# Patient Record
Sex: Female | Born: 1975 | Race: Black or African American | Hispanic: No | Marital: Married | Smoking: Never smoker
Health system: Southern US, Community
[De-identification: ages and names within clinical notes are randomized; demographics above are authoritative.]

## PROBLEM LIST (undated history)

## (undated) DIAGNOSIS — Z87442 Personal history of urinary calculi: Secondary | ICD-10-CM

## (undated) DIAGNOSIS — I10 Essential (primary) hypertension: Secondary | ICD-10-CM

## (undated) DIAGNOSIS — E559 Vitamin D deficiency, unspecified: Secondary | ICD-10-CM

## (undated) DIAGNOSIS — K219 Gastro-esophageal reflux disease without esophagitis: Secondary | ICD-10-CM

## (undated) HISTORY — PX: CHOLECYSTECTOMY: SHX55

## (undated) HISTORY — DX: Essential (primary) hypertension: I10

## (undated) HISTORY — PX: OTHER SURGICAL HISTORY: SHX169

## (undated) HISTORY — PX: DILATION AND EVACUATION: SHX1459

## (undated) HISTORY — DX: Vitamin D deficiency, unspecified: E55.9

---

## 2000-10-10 ENCOUNTER — Emergency Department (HOSPITAL_COMMUNITY): Admission: EM | Admit: 2000-10-10 | Discharge: 2000-10-10 | Payer: Self-pay | Admitting: *Deleted

## 2001-07-13 ENCOUNTER — Emergency Department (HOSPITAL_COMMUNITY): Admission: EM | Admit: 2001-07-13 | Discharge: 2001-07-13 | Payer: Self-pay | Admitting: Emergency Medicine

## 2002-03-24 ENCOUNTER — Encounter: Admission: RE | Admit: 2002-03-24 | Discharge: 2002-03-24 | Payer: Self-pay | Admitting: Nephrology

## 2002-03-24 ENCOUNTER — Encounter: Payer: Self-pay | Admitting: Nephrology

## 2002-07-07 ENCOUNTER — Other Ambulatory Visit: Admission: RE | Admit: 2002-07-07 | Discharge: 2002-07-07 | Payer: Self-pay | Admitting: Obstetrics & Gynecology

## 2003-04-09 ENCOUNTER — Inpatient Hospital Stay (HOSPITAL_COMMUNITY): Admission: AD | Admit: 2003-04-09 | Discharge: 2003-04-09 | Payer: Self-pay | Admitting: Obstetrics and Gynecology

## 2003-05-20 ENCOUNTER — Inpatient Hospital Stay (HOSPITAL_COMMUNITY): Admission: AD | Admit: 2003-05-20 | Discharge: 2003-05-25 | Payer: Self-pay | Admitting: Obstetrics and Gynecology

## 2003-05-22 ENCOUNTER — Encounter (INDEPENDENT_AMBULATORY_CARE_PROVIDER_SITE_OTHER): Payer: Self-pay | Admitting: Specialist

## 2003-07-09 ENCOUNTER — Other Ambulatory Visit: Admission: RE | Admit: 2003-07-09 | Discharge: 2003-07-09 | Payer: Self-pay | Admitting: Obstetrics and Gynecology

## 2004-01-28 ENCOUNTER — Observation Stay (HOSPITAL_COMMUNITY): Admission: RE | Admit: 2004-01-28 | Discharge: 2004-01-29 | Payer: Self-pay | Admitting: General Surgery

## 2004-01-28 ENCOUNTER — Encounter (INDEPENDENT_AMBULATORY_CARE_PROVIDER_SITE_OTHER): Payer: Self-pay | Admitting: Specialist

## 2004-10-27 ENCOUNTER — Emergency Department (HOSPITAL_COMMUNITY): Admission: EM | Admit: 2004-10-27 | Discharge: 2004-10-28 | Payer: Self-pay | Admitting: Emergency Medicine

## 2004-11-02 ENCOUNTER — Ambulatory Visit (HOSPITAL_COMMUNITY): Admission: RE | Admit: 2004-11-02 | Discharge: 2004-11-02 | Payer: Self-pay | Admitting: Urology

## 2004-11-02 ENCOUNTER — Ambulatory Visit (HOSPITAL_BASED_OUTPATIENT_CLINIC_OR_DEPARTMENT_OTHER): Admission: RE | Admit: 2004-11-02 | Discharge: 2004-11-02 | Payer: Self-pay | Admitting: Urology

## 2005-04-13 ENCOUNTER — Ambulatory Visit: Payer: Self-pay | Admitting: Family Medicine

## 2005-05-11 ENCOUNTER — Encounter (INDEPENDENT_AMBULATORY_CARE_PROVIDER_SITE_OTHER): Payer: Self-pay | Admitting: Specialist

## 2005-05-11 ENCOUNTER — Ambulatory Visit (HOSPITAL_BASED_OUTPATIENT_CLINIC_OR_DEPARTMENT_OTHER): Admission: RE | Admit: 2005-05-11 | Discharge: 2005-05-11 | Payer: Self-pay | Admitting: Urology

## 2005-05-31 ENCOUNTER — Ambulatory Visit (HOSPITAL_COMMUNITY): Admission: RE | Admit: 2005-05-31 | Discharge: 2005-05-31 | Payer: Self-pay | Admitting: Urology

## 2006-07-25 ENCOUNTER — Inpatient Hospital Stay (HOSPITAL_COMMUNITY): Admission: AD | Admit: 2006-07-25 | Discharge: 2006-07-28 | Payer: Self-pay | Admitting: Obstetrics and Gynecology

## 2006-08-07 ENCOUNTER — Inpatient Hospital Stay (HOSPITAL_COMMUNITY): Admission: AD | Admit: 2006-08-07 | Discharge: 2006-08-07 | Payer: Self-pay | Admitting: Obstetrics & Gynecology

## 2006-08-31 ENCOUNTER — Inpatient Hospital Stay (HOSPITAL_COMMUNITY): Admission: AD | Admit: 2006-08-31 | Discharge: 2006-08-31 | Payer: Self-pay | Admitting: Obstetrics

## 2006-09-13 ENCOUNTER — Inpatient Hospital Stay (HOSPITAL_COMMUNITY): Admission: RE | Admit: 2006-09-13 | Discharge: 2006-09-15 | Payer: Self-pay | Admitting: Obstetrics and Gynecology

## 2006-09-13 ENCOUNTER — Encounter (INDEPENDENT_AMBULATORY_CARE_PROVIDER_SITE_OTHER): Payer: Self-pay | Admitting: Specialist

## 2006-11-07 ENCOUNTER — Ambulatory Visit (HOSPITAL_COMMUNITY): Admission: RE | Admit: 2006-11-07 | Discharge: 2006-11-07 | Payer: Self-pay | Admitting: Urology

## 2006-12-11 ENCOUNTER — Ambulatory Visit (HOSPITAL_COMMUNITY): Admission: RE | Admit: 2006-12-11 | Discharge: 2006-12-11 | Payer: Self-pay | Admitting: Urology

## 2007-01-16 ENCOUNTER — Ambulatory Visit: Payer: Self-pay | Admitting: Family Medicine

## 2007-01-16 DIAGNOSIS — I10 Essential (primary) hypertension: Secondary | ICD-10-CM | POA: Insufficient documentation

## 2007-01-20 LAB — CONVERTED CEMR LAB
ALT: 18 units/L (ref 0–35)
AST: 20 units/L (ref 0–37)
Albumin: 4.2 g/dL (ref 3.5–5.2)
Alkaline Phosphatase: 108 units/L (ref 39–117)
BUN: 10 mg/dL (ref 6–23)
Bilirubin, Direct: 0.1 mg/dL (ref 0.0–0.3)
CO2: 29 meq/L (ref 19–32)
Calcium: 9.4 mg/dL (ref 8.4–10.5)
Chloride: 105 meq/L (ref 96–112)
Creatinine, Ser: 0.9 mg/dL (ref 0.4–1.2)
GFR calc Af Amer: 96 mL/min
GFR calc non Af Amer: 79 mL/min
Glucose, Bld: 69 mg/dL — ABNORMAL LOW (ref 70–99)
Potassium: 4.5 meq/L (ref 3.5–5.1)
Sodium: 142 meq/L (ref 135–145)
Total Bilirubin: 0.8 mg/dL (ref 0.3–1.2)
Total Protein: 8.2 g/dL (ref 6.0–8.3)

## 2007-01-21 ENCOUNTER — Telehealth (INDEPENDENT_AMBULATORY_CARE_PROVIDER_SITE_OTHER): Payer: Self-pay | Admitting: *Deleted

## 2007-05-27 ENCOUNTER — Ambulatory Visit: Payer: Self-pay | Admitting: Family Medicine

## 2007-05-27 DIAGNOSIS — M899 Disorder of bone, unspecified: Secondary | ICD-10-CM | POA: Insufficient documentation

## 2007-05-27 DIAGNOSIS — M949 Disorder of cartilage, unspecified: Secondary | ICD-10-CM

## 2007-10-20 ENCOUNTER — Telehealth (INDEPENDENT_AMBULATORY_CARE_PROVIDER_SITE_OTHER): Payer: Self-pay | Admitting: *Deleted

## 2007-10-20 ENCOUNTER — Ambulatory Visit: Payer: Self-pay | Admitting: Family Medicine

## 2007-10-20 DIAGNOSIS — J029 Acute pharyngitis, unspecified: Secondary | ICD-10-CM

## 2007-10-20 LAB — CONVERTED CEMR LAB: Rapid Strep: NEGATIVE

## 2008-05-17 IMAGING — US US RENAL
1 series · 13 of 25 positions shown · non-contrast
Comparison: none

CLINICAL DATA: 31 weeks estimated gestational age with lower back pain. Patient has a history of prior lithotripsy and stent placement with prior pregnancy in 9333.
 RENAL/URINARY TRACT ULTRASOUND ? 07/25/06:
TECHNIQUE: Complete ultrasound examination of the urinary tract was performed including evaluation of the kidneys, renal collecting systems, and urinary bladder.

[Series 1: us renal · 0.28mm/px · 13 of 41 slices shown]
[im 1/41]
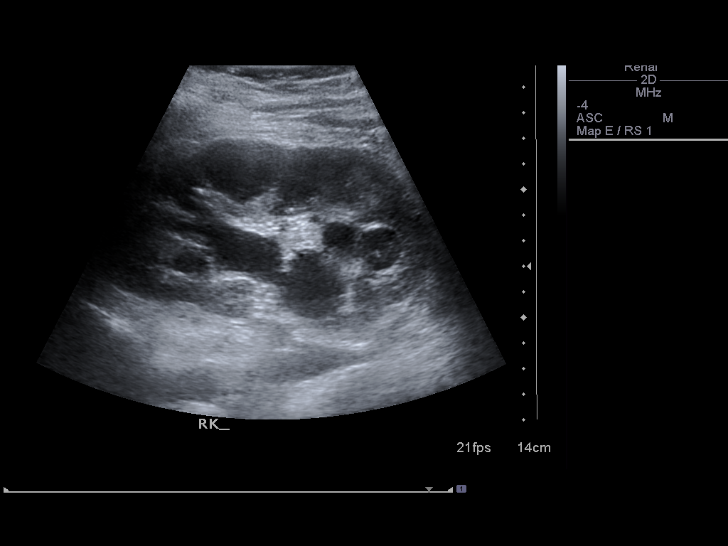
[im 4/41]
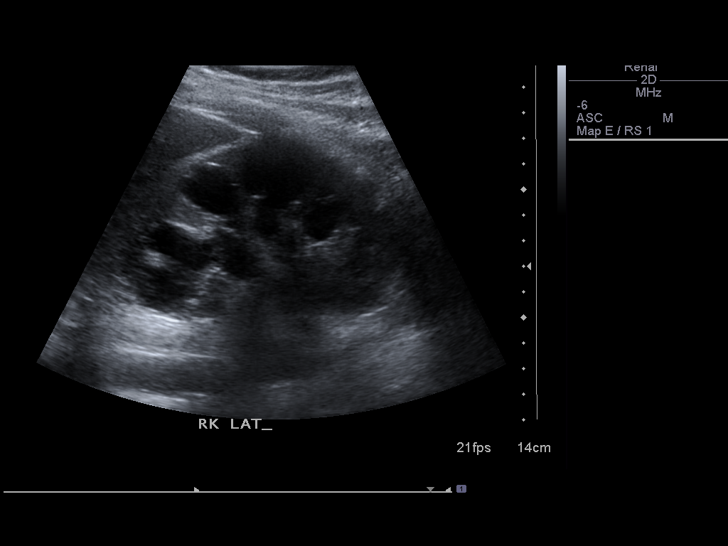
[im 7/41]
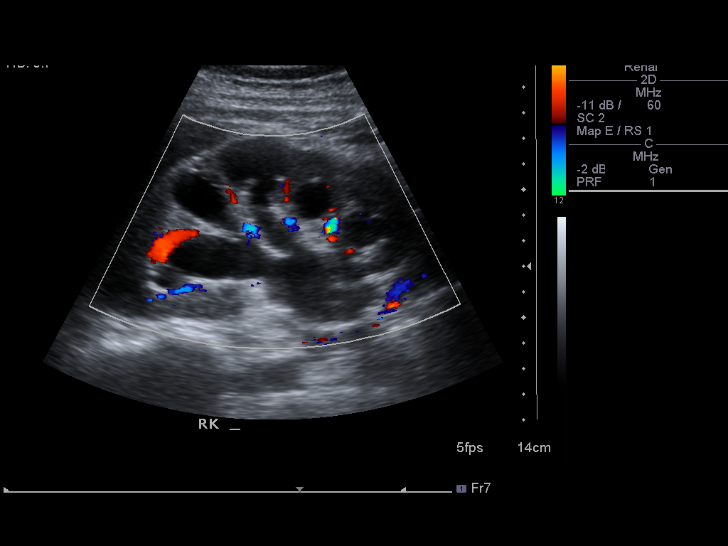
[im 11/41]
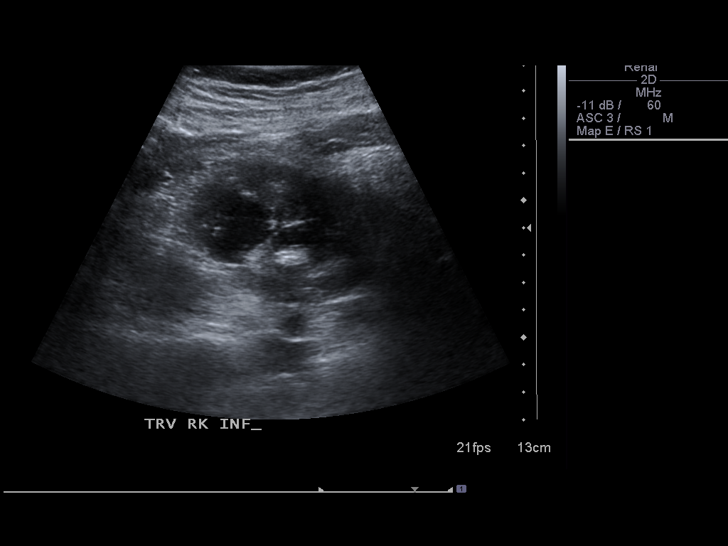
[im 14/41]
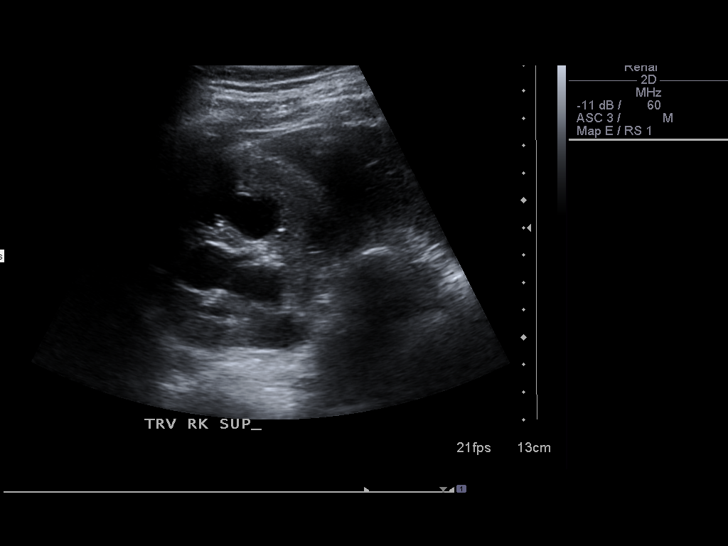
[im 17/41]
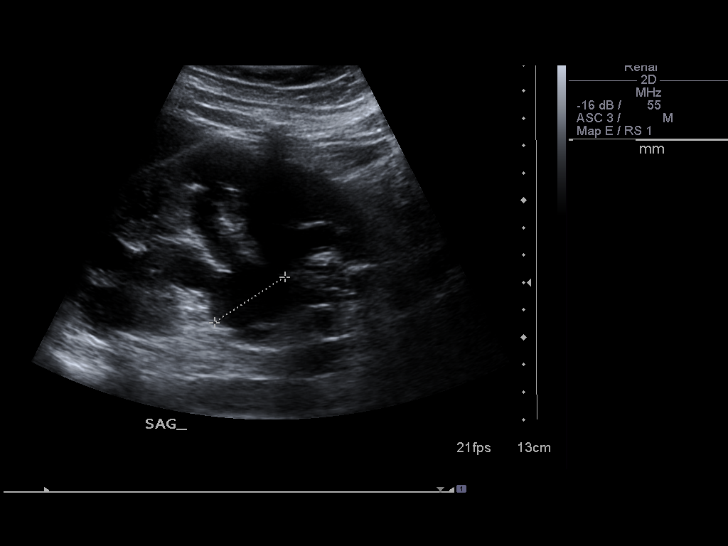
[im 21/41]
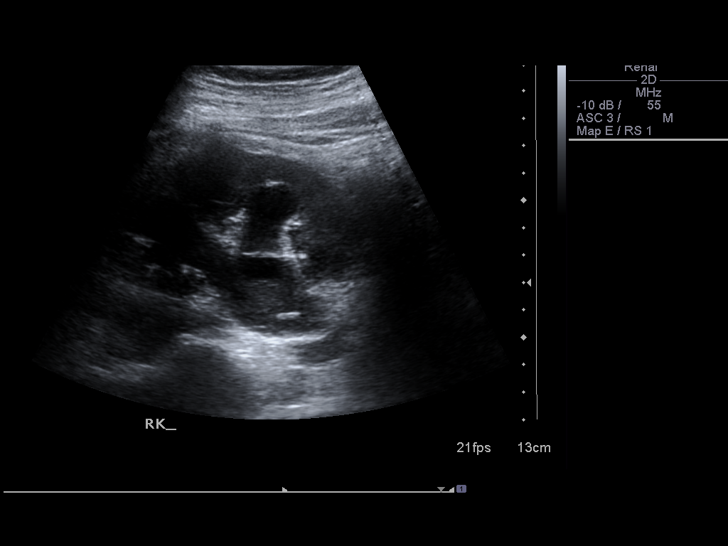
[im 24/41]
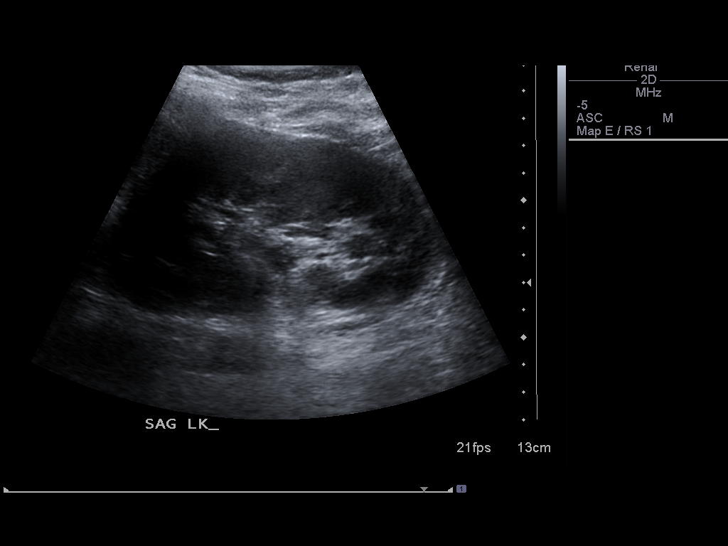
[im 27/41]
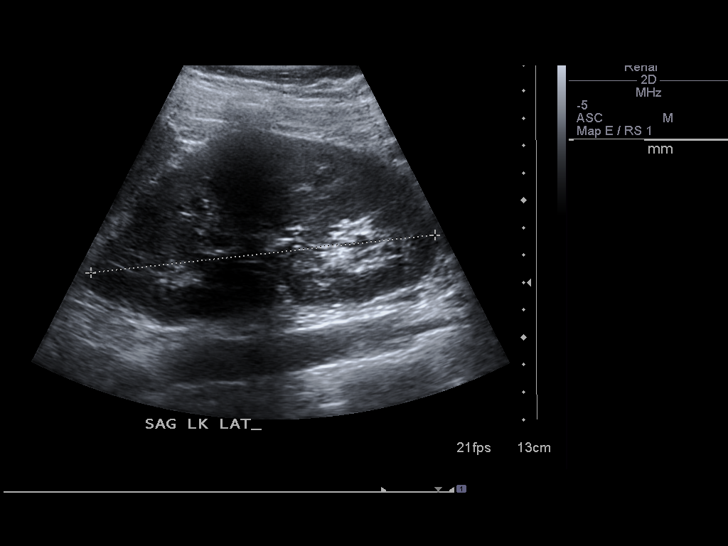
[im 31/41]
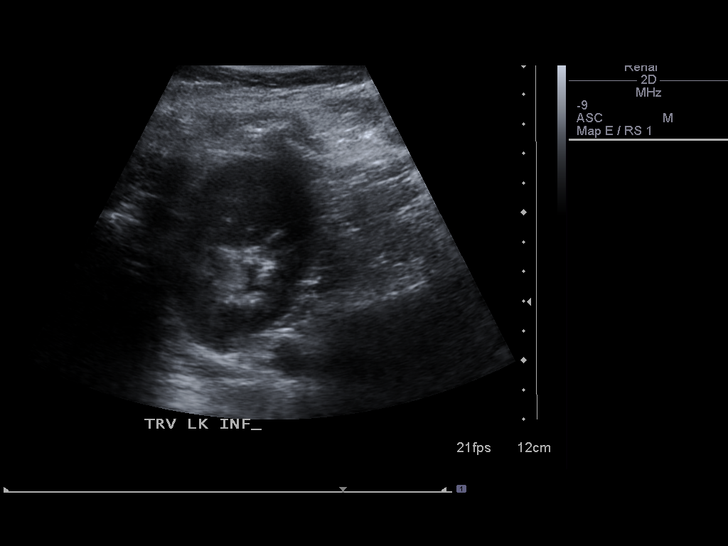
[im 34/41]
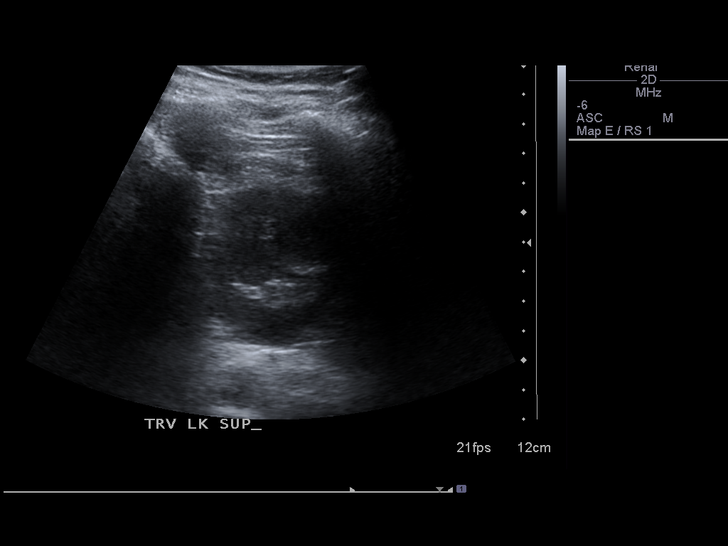
[im 37/41]
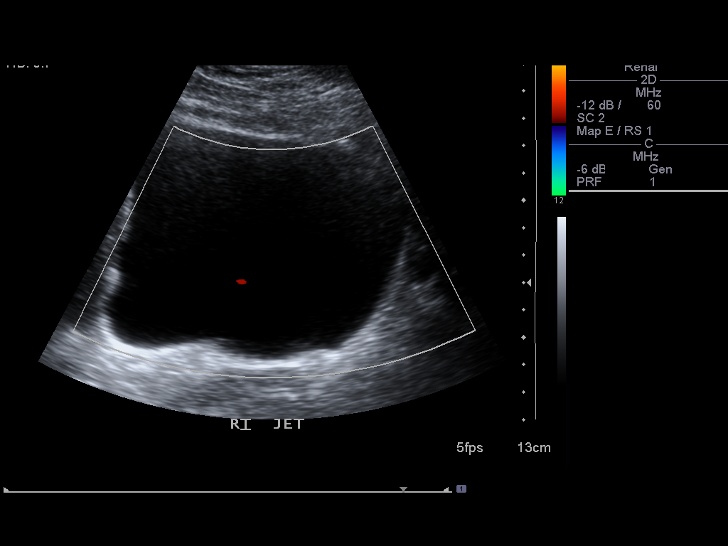
[im 41/41]
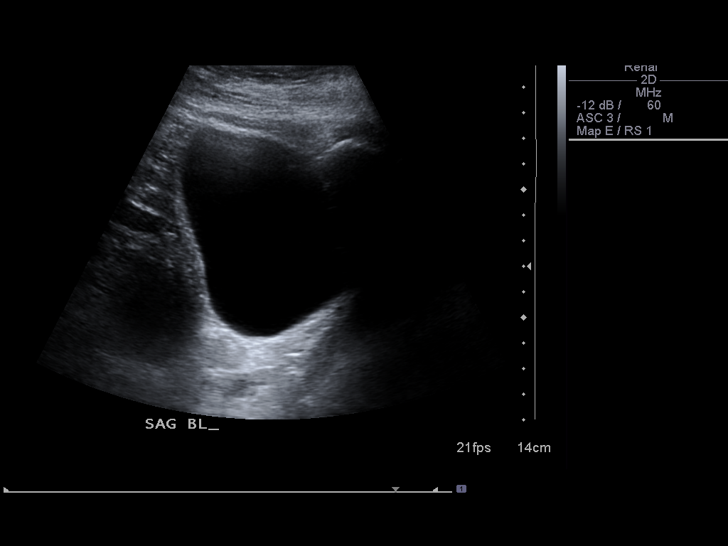

[13 of 25 positions shown; findings below may reference images not displayed]

FINDINGS: Multiple images of both kidneys were obtained.  The right kidney has a sagittal length of 11.6 cm and the left kidney has a sagittal length of 12.6 cm.  
 The right kidney is notable for the presence of moderate to severe pyelectasis with associated caliectasis and proximal ureterectasis.  The proximal ureter could not be followed much past the level of the renal pelvis.  There is a focal renal calculus identified in a lower pole calix measuring 1.2 cm in maximal length.  Visualization of the renal pelvis and proximal aspect of the ureter suggests the presence of some low-level echoes as well as possibly one echogenic focus which does not shadow and the finding is concerning for the possibility of some debris within the renal pelvis and possibly a nonshadowing calculus in this location as well.  Evaluation of the bladder does not reveal the presence of a right ureteral jet and this constellation of findings is worrisome for the possibility of an underlying obstructive process on this side.
 The left kidney has a normal appearance with no signs of pyelectasis, caliectasis, or focal parenchymal abnormality.  A strong left ureteral jet was noted.
IMPRESSION: 1.  Right lower pole renal calculus with moderate to severe pyelectasis and associated caliectasis and question of debris with a possible nonshadowing calculus in the region of the dilated right ureteropelvic junction.  These findings combined with lack of a visualized ureteral jet on this side raises the possibility of an obstructive process on this side.
 2.  Normal left kidney.

## 2010-07-23 ENCOUNTER — Encounter: Payer: Self-pay | Admitting: Urology

## 2010-07-24 ENCOUNTER — Encounter: Payer: Self-pay | Admitting: Urology

## 2010-11-14 NOTE — Op Note (Signed)
NAME:  Christine Walter, Christine Walter              ACCOUNT NO.:  [id number]   MEDICAL RECORD NO.:  [id number]          PATIENT TYPE:  AMB   LOCATION:  DAY                          FACILITY:  [facility]   PHYSICIAN:  Heloise Purpura, MD      DATE OF BIRTH:  1975/08/05   DATE OF PROCEDURE:  12/11/2006  DATE OF DISCHARGE:                               OPERATIVE REPORT   PREOPERATIVE DIAGNOSIS:  1. Right renal calculi.  2. Poorly functioning right kidney.   POSTOPERATIVE DIAGNOSIS:  1. Right renal calculi.  2. Poorly functioning right kidney.   PROCEDURE:  1. Cystoscopy.  2. Right retrograde pyelography.  3. Right ureteroscopy with laser lithotripsy and stone removal.  4. Right ureteral stent placement (6 by 24).   SURGEON:  Heloise Purpura, M.D.   ANESTHESIA:  General.   COMPLICATIONS:  None.   INDICATION:  Christine Walter is a 35 year old female who was recently found  to have an obstructing UPJ calculus with fever during her recent  pregnancy.  She subsequently delivered her child and followed up for  further evaluation.  She underwent a CT scan which demonstrated multiple  large right renal calculi in the lower and interpolar regions of the  kidney.  She was also found to have an atrophic right kidney.  She,  therefore, underwent a nuclear medicine renal scan which demonstrated  17% relative renal function of the right kidney.  We discussed this  finding and due to the patient's young age and history of hypertension,  to preserve as much nephrology function as possible.  We discussed  various approaches to her stones and she elected to undergo a  ureteroscopic procedure.  The potential risks and benefits of the above  procedures were discussed with the patient and informed consent was  obtained.   DESCRIPTION OF PROCEDURE:  The patient was taken to the operating room  and a general anesthetic was administered.  She was given preoperative  antibiotics, placed in the dorsal lithotomy position,  prepped and draped  in the usual sterile fashion.  Next, a preoperative time out was  performed.  Cystourethroscopy was then performed and the right ureteral  orifice was identified.  No bladder tumors, stones, or other mucosal  pathology was identified.  A 0.038 sensor guidewire was inserted up  through the right ureteral orifice into the right renal pelvis under  fluoroscopic guidance.  A 12/14 French ureteral access sheath was then  advanced over the wire.  The flexible ureteroscope was then advanced up  into the renal pelvis under direct visual guidance.  The entire renal  collecting system was examined.  No stones were identified in the upper  or interpolar regions of the kidney.  In the lower pole, two calyces  were found to have large renal calculi.  Laser lithotripsy was performed  of these stones.  Once the stones were adequately fragmented, the  nitinol basket was used to remove some fragments for stone analysis.  No  additional fragments were identified.  On CT scan, there was noted to be  an approximately 1 cm in the interpolar region.  This was initially not  identified.  A retrograde pyelogram was then performed and the kidney  was further evaluated.  A calculus was seen just below a layer of mucosa  in the interpolar region of the kidney.  This was identified and laser  lithotripsy was performed until the stone was adequately fragmented into  small pieces.  At this time, the ureteroscope was removed and a 0.038  sensor guidewire was advanced back up into the renal pelvis.  The  ureteral access sheath was removed.  A 6 by 24 double-J ureteral stent  was then advanced over the wire using Seldinger technique.  It was  appropriately positioned under fluoroscopic guidance and the wire was  removed with a good curl noted in the renal pelvis as well as in the  bladder.  The patient's bladder was emptied.  The patient tolerated the  procedure well without complications.  She was  able to be awakened and  transferred to the recovery unit in satisfactory condition.           ______________________________  Heloise Purpura, MD  Electronically Signed     LB/MEDQ  D:  12/11/2006  T:  12/11/2006  Job:  [id number]

## 2010-11-17 NOTE — Consult Note (Signed)
NAME:  Christine Walter, Christine Walter NO.:  [id number]   MEDICAL RECORD NO.:  [id number]          PATIENT TYPE:  MAT   LOCATION:  MATC                          FACILITY:  [facility]   PHYSICIAN:  Richardean Sale, M.D.   DATE OF BIRTH:  12/01/1975   DATE OF CONSULTATION:  07/25/2006  DATE OF DISCHARGE:                                 CONSULTATION   CHIEF COMPLAINT:  Back pain.   HISTORY OF PRESENT ILLNESS:  This is a 35 year old African-American  female, gravida 3, para 1-0-1-1, who was at [redacted] weeks gestation with a  due date of September 25, 2006, who presented to maternity admissions on  July 25, 2006 complaining of back pain.  Patient states her pain  began approximately 2 days ago, started out on the right side in the  upper back and is now across both sides of her back, both upper and  lower.  She reports fever of 100.5, as well as a few episodes of emesis  and decreased appetite.  She denies any dysuria or hematuria but does  complain of hesitancy, denies any vaginal bleeding, loss of fluid or  contractions, reports good fetal movement.   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  Patient had a stent in the right kidney that removed in 2006.  She is  followed by Dr. Wanda Plump for her urologic problems but has not been to  see him recently.  She has a history of recurrent UTIs and is on  Macrobid for prophylaxis.   OBSTETRIC HISTORY:  TAB x1, 39 weeks vaginal delivery x1.   SOCIAL HISTORY:  Denies tobacco, alcohol or drugs.   MEDICATIONS:  1. Macrobid.  2. Prenatal vitamins.  3. Tylenol.  4. Procardia.   ALLERGIES:  NO KNOWN DRUG ALLERGIES.   PHYSICAL EXAMINATION:  VITAL SIGNS:  Blood pressure is 108/73, pulse  124, respirations 20, temperature 98.3, fetal heart tones are in the  130s.  No decelerations, moderate variability, positive accelerations.  Tocometer:  Rare contraction.  GENERAL:  She is a well-developed, well-nourished  African-American  female who appears in no acute distress.  ABDOMEN:  Gravid, soft and nontender.  EXTREMITIES:  No cyanosis, clubbing or edema.  CERVIX:  Closed, thick and long.  BACK:  Bilateral CVA tenderness, right greater than left.   LABORATORY STUDIES:  Urinalysis shows specific gravity greater than  1.03, 100 of glucose, trace hemoglobin, moderate bilirubin, greater than  80 ketones, 100 of protein, positive nitrite and small leukocyte  esterase.  On the microscopic, white blood cells are 7-10 and bacteria  are many.   ASSESSMENT:  A 35 year old gravida 3, para 1-0-1-1 African-American  female at [redacted] weeks gestation, with flank pain, history of renal disease,  urinalysis suggestive of UTI.   PLAN:  1. Will start IV fluids and Ancef for suspected UTI.  2. Check renal ultrasound, evaluate for poly-nephritis and      hydronephrosis, given patient's history of renal disease.  3. Patient complains of pain at an 8/10.  Will use Stadol 1 mg with      Phenergan 12.5 mg  and re-dose as needed.  4. NBO until renal ultrasound is available and the urologic surgery is      required.  5. Continue Procardia for hypertension.  6. Continue fetal monitoring.      Richardean Sale, M.D.  Electronically Signed     JW/MEDQ  D:  07/25/2006  T:  07/25/2006  Job:  [id number]

## 2010-11-17 NOTE — Discharge Summary (Signed)
NAME:  PAIDYN, MCFERRAN                        ACCOUNT NO.:  [id number]   MEDICAL RECORD NO.:  [id number]                   PATIENT TYPE:  INP   LOCATION:  9133                                 FACILITY:  [facility]   PHYSICIAN:  Maxie Better, M.D.            DATE OF BIRTH:  December 06, 1975   DATE OF ADMISSION:  05/20/2003  DATE OF DISCHARGE:  05/25/2003                                 DISCHARGE SUMMARY   ADMISSION DIAGNOSES:  1. Intrauterine gestation at 38-5/7 weeks.  2. Chronic hypertension.  3. Term gestation, delivered.  4. Recurrent urinary tract infections.   DISCHARGE DIAGNOSES:  1. Term gestation, delivered.  2. Recurrent urinary tract infections.   PROCEDURE:  Vacuum assisted vaginal delivery.   HISTORY OF PRESENT ILLNESS:  This is a 35 year old gravida 2, para 0-0-1-0  married black female with chronic hypertension controlled on Procardia XL  admitted at 38-5/[redacted] weeks gestation for induction of labor.  Her prenatal  course had been complicated by recurrent urinary tract infections resulting  in suppressive therapy using Macrobid.  She had antepartum fetal  surveillance with nonstress testing.  Her last ultrasound on April 01, 2003 showed normal fetal growth.  PIH laboratories done on May 17, 2003  showed a uric acid of 6.1, platelet count of 139,000, hematocrit 35.  Normal  LFTs.  The patient's blood type is O+.  Rubella is immune.  Hepatitis B  surface antigen is negative.  Group B Strep culture was negative.   HOSPITAL COURSE:  The patient was admitted to [facility] on May 20, 2003.  She was continued on her Procardia XL as well as Macrobid for her  recurrent urinary tract infections.  Cervidil was placed for cervical  ripening due to her admission examination of closed, soft, -2, vertex  presentation.  PIH laboratories were reviewed.  On May 21, 2003 the  patient was started on Pitocin.  She reached 20 milliunits per minute  without any  cervical change.  Her tracing was reassuring.  Pitocin was  discontinued and cervical ripening was reinitiated using Cytotec.  After the  Cytotec on May 22, 2003 the patient's cervix improved to 1 cm, 70%, -2.  Artificial rupture of membranes was performed.  Moderate meconium was noted.  An intrauterine pressure catheter and internal scalp electrodes were placed.  Tracing during that time showed a baseline fetal heart rate of 140,  accelerations to 160, contractions every one to four minutes.  Pitocin on  the low dose protocol was started.  Amnioinfusion was also started.  The  patient was noted to have a dysfunctional uterine pattern and Pitocin was  continued as was the Amnioinfusion.  She subsequently progressed, became  fully dilated at 2:40 a.m.  Variable decelerations were noted with the  contractions.  Repetitive deep variable decelerations with each contraction  resulted in decision to apply vacuum extraction.  At that time the cervix  was fully +3, left  occiput anterior presentation.  Pull times:  1.  Vaginal  delivery live female.  DeLee'd on the perineum.  Cord around the neck x2 and  on the body which were reduced.  Apgars of 8 and 9.  Left lateral vaginal  laceration was noted.  It was repaired.  Weight of the baby was 7 pounds 13  ounces.  The patient subsequently had an uncomplicated postpartum course.  By postpartum day #2 she was doing well and blood pressure was 108/74 and  tachycardia.  She was deemed well to be discharged home.   DISPOSITION:  Home.   CONDITION ON DISCHARGE:  Stable.   DISCHARGE MEDICATIONS:  1. Macrobid 100 mg p.o. q.h.s.  2. Prenatal vitamins one p.o. daily.  3. Procardia XL as previously prescribed.  4. Nonsteroidals as needed.   FOLLOWUP:  Four to six weeks.   DISCHARGE INSTRUCTIONS:  Call for temperature greater than or equal to  100.4.  Nothing per vagina for four to six weeks.  Call if soaking a regular  pad every hour or more  frequently.                                               Maxie Better, M.D.    [place]/MEDQ  D:  06/24/2003  T:  06/24/2003  Job:  [id number]

## 2010-11-17 NOTE — Consult Note (Signed)
NAME:  Christine Walter, Christine Walter              ACCOUNT NO.:  [id number]   MEDICAL RECORD NO.:  [id number]          PATIENT TYPE:  INP   LOCATION:  9198                          FACILITY:  [facility]   PHYSICIAN:  Heloise Purpura, MD      DATE OF BIRTH:  [birth year]   DATE OF CONSULTATION:  07/25/2006  DATE OF DISCHARGE:                                 CONSULTATION   REASON FOR CONSULTATION:  Right hydronephrosis and fever.   HISTORY:  Christine Walter is a 35 year old G3, P4 female who is currently  [redacted] weeks pregnant.  She began having right-sided flank pain 2 days ago  and yesterday had nausea associated with fever up to 101 degrees.  Her  pain continued and therefore she presented to the maternity admissions  unit for further evaluation.  Her pain does radiate to the right lower  quadrant from the right flank.  She has not had any gross hematuria,  dysuria or other lower urinary tract symptoms except for urinary  frequency.   PAST MEDICAL HISTORY:  1. Hypertension.  2. Kidney stones previously treated by Dr. Wanda Plump.   PAST SURGICAL HISTORY:  Ureteral stent placement.   MEDICATIONS:  Procardia.   ALLERGIES:  NO KNOWN DRUG ALLERGIES.   FAMILY HISTORY:  No history of urolithiasis.   SOCIAL HISTORY:  No tobacco.   REVIEW OF SYSTEMS:  Positive for recent fever, nausea and vomiting.  All  other systems are reviewed and are negative.   PHYSICAL EXAMINATION:  VITALS:  Temperature 100.8, blood pressure  104/63, pulse 131, respirations 20.  CONSTITUTIONAL:  Alert and oriented, no acute distress.  CARDIOVASCULAR:  Sinus tachycardia without obvious murmurs.  LUNGS:  Clear bilaterally.  ABDOMEN:  Gravid with some right-sided abdominal tenderness on  palpation.  BACK:  Positive right CVA tenderness.  GU:  Deferred.  EXTREMITIES:  Trace bilateral lower extremity edema.  NEUROLOGIC:  Grossly intact.   LABORATORIES:  Urinalysis 7-10 white blood cells and many bacteria.  White blood count 16.9,  creatinine 0.9.   IMAGING:  The patient's renal ultrasound was independently reviewed and  demonstrates right-sided hydronephrosis with what appears to be a 1.2 cm  lower pole renal calculus.  The patient also has ureteral dilation.  No  definite ureteral calculus is identified.  However, no evidence of a  right ureteral jet is identified.   IMPRESSION:  Right hydronephrosis likely secondary to right ureteral  obstruction with fever.   PLAN:  I have discussed these findings with Christine Walter.  Although a  definite diagnosis of a ureteral calculus cannot be made, based on the  fact that she does have a history of stones and does appear to have a  renal calculus, it would be most likely that she does have a ureteral  obstructing calculus.  Due to the fact that she does have fever and  leukocytosis along with what appears to be an infected urinalysis, I  have recommended proceeding with renal decompression.  We have discussed  options including nephrostomy tube placement or ureteral stent  placement.  After our discussion, the patient has elected to proceed  with cystoscopy and right ureteral stent placement.   We discussed the risks of this procedure including but not limited to  bleeding, infection, inability to place a stent requiring subsequent  nephrostomy tube placement, and the possibility of fetal loss.  We have  discussed using the minimal amount of radiation and shielding the fetus.  We have also discussed the fact that definitive management for her stone  would take place following her delivery.           ______________________________  Heloise Purpura, MD  Electronically Signed     LB/MEDQ  D:  07/25/2006  T:  07/25/2006  Job:  [id number]   cc:   Richardean Sale, M.D.  Fax: [phone]   Maxie Better, M.D.  Fax: [phone]

## 2010-11-17 NOTE — Op Note (Signed)
NAME:  Christine Walter, POSPISIL              ACCOUNT NO.:  [id number]   MEDICAL RECORD NO.:  [id number]          PATIENT TYPE:  AMB   LOCATION:  [facility]                         FACILITY:  [facility]   PHYSICIAN:  [place] [employer], M.D.DATE OF BIRTH:  05-22-1976   DATE OF PROCEDURE:  11/02/2004  DATE OF DISCHARGE:                                 OPERATIVE REPORT   Please see Dr. Valda Lamb emergency room ER note from October 28, 2004, as  well as the CT scan done at time of the emergency room visit on October 28, 2004.  References is also made to repeat CT scan by Dr. Aldean Ast Oct 31, 2004.   PREOPERATIVE DIAGNOSIS:  Right-sided hydronephrosis right ureteropelvic  junction versus right ureterovesical junction obstruction.   POSTOPERATIVE DIAGNOSIS:  Necrotic debris distal right ureter.   PROCEDURE:  1.  Cystoscopy.  2.  Bilateral retrogrades.  3.  Ureteroscopy.  4.  Right double-J stent placement.   SURGEON:  Dr. Wanda Plump   ASSISTANT:  None.   ANESTHESIA:  General.   SPECIMENS:  None.   ESTIMATED BLOOD LOSS:  Minimal.   DRAINS:  A 6-French 28 cm double-J stent.   COMPLICATIONS:  None obvious.   DESCRIPTION OF PROCEDURE:  The patient was prepped and draped in the  dorsolithotomy position after institution of an adequate level of general  anesthesia.  Made a good faith effort to review CT scans from October 28, 2004  and Oct 31, 2004.  A 22 French panendoscope was then gently inserted at the  urethral meatus, normal urethra and sphincter, normal trigone and orifices.  Bladder was carefully inspected with the 12 and 70-degree lenses and showed  no obvious evidence of intravesical pathology.  Left retrograde showed  normal course and caliber of the ureter, pelvis, and calyces with prompt  drainage at 3-5 minutes.  Right retrograde showed elongated, irregularly  shaped filling defect within the distal ureter consistent with either  necrotic debris, pus, or papillary necrosis.  The patient  had marked  proximal hydroureteronephrosis with marked tortuosity of the proximal  ureter.  Floppy tip Glidewire was passed beyond the distal ureteral  obstruction.  End-hole catheter was passed beyond the level of obstruction,  and the hydronephrotic drip was allowed to subside over a period of about 50-  20 minutes.  Retrograde films were then performed which showed tortuosity of  the proximal ureter and what appeared to be about a 6 x 18 mm filling defect  within the distal ureter, apparently soft tissue.  With great care, the  Glidewire was negotiated through the proximal ureter and coiled nicely in  the dilated upper pole calyces.  The 6-French end-hole catheter was then  passed over the Glidewire with some straightening of the proximal ureter.  The Glidewire was removed and replaced with a guidewire.  The end-hole  catheter was then withdrawn, and the short 6-French ureteroscope was  inserted alongside the guidewire.  The patient had what appeared to be  abundant necrotic debris within the distal ureter, had a yellowish, almost  purulent character.  No obvious calculi were identified.  Attempts  made to  grasp the necrotic debris were unsuccessful, as small bits of the debris  would come off, but the large bulk remained within the distal ureter.  Ureteroscope was then advanced to the limit of the short 6-French scope.  No  other obvious interureteral pathology.  Ureteroscope was then withdrawn.  A  6-French 28 cm double-J stent was selected due to tortuosity of the proximal  ureter.  It was advanced over the guidewire with excellent pigtail formation  in the upper pole calyces and in the bladder.  There was prompt efflux of  cloudy urine through the fenestrations of the double-J stent.  Bladder was  drained.  The patient was given a B&O suppository and returned to recovery  in satisfactory condition.      RH/MEDQ  D:  11/02/2004  T:  11/02/2004  Job:  [id number]   cc:   Maxie Better, M.D.  [address]  [place]  [place] [address]  Fax: [phone]   [place] [employer], M.D.  [address]  [place]  [place] [address]  Fax: [phone]

## 2010-11-17 NOTE — Op Note (Signed)
NAME:  Christine Walter, Christine Walter              ACCOUNT NO.:  [id number]   MEDICAL RECORD NO.:  [id number]          PATIENT TYPE:  INP   LOCATION:  9198                          FACILITY:  [facility]   PHYSICIAN:  Heloise Purpura, MD      DATE OF BIRTH:  Aug 13, 1975   DATE OF PROCEDURE:  07/25/2006  DATE OF DISCHARGE:                               OPERATIVE REPORT   PREOPERATIVE DIAGNOSES:  1. Right ureteral obstruction.  2. Fever.  3. Pregnancy   POSTOPERATIVE DIAGNOSES:  1. Right ureteral obstruction.  2. Fever.  3. Pregnancy   OPERATION PERFORMED:  1. Cystoscopy.  2. Right ureteral stent placement (6 x 24).   SURGEON:  Heloise Purpura, M.D.   ANESTHESIA:  Spinal.   COMPLICATIONS:  None.   INDICATIONS:  Ms. Nakajima is a 35 year old female who has a history of  kidney stones.  She is currently [redacted] weeks pregnant and began having  severe right-sided flank pain and fever.  Renal ultrasound was performed  and demonstrated hydronephrosis and findings consistent with a renal  calculus, and possible ureteral calculus.  Due to the fact that the  patient was febrile and had findings on her urinalysis consistent with  infection  it was decided to proceed with renal decompression.   The above procedure including risks were discussed with the patient and  she consented.   DESCRIPTION OF THE OPERATION:  The patient was brought to the operating  room and a spinal anesthetic was administered. She was given  preoperative antibiotics, placed in the dorsal lithotomy position, and  was prepped and draped in the usual sterile fashion.  Preoperative  timeout was performed.  Cystourethroscopy was then performed.  Prior to  placement of the scope, urethral dilation was necessary with the female  sounds.  The 22 French cystoscope sheath was then advanced into the  bladder.   The bladder was examined.  There was noted to be a large amount of  debris, which was removed from the bladder.  The right ureteral  orifice  was then identified and a 0.038 Sensor guidewire was advanced through a  6 [place] ureteral catheter, into the ureteral orifice and up into the  renal pelvis.  The wire was advanced easily and without difficulty.  Spot fluoroscopy of the kidney was performed to confirm placement into  the renal pelvis.   A 6 x 24 double J ureteral stent was then advanced over the wire using  the Seldinger technique.  It was appropriately positioned under spot  fluoroscopy of the kidney and cystoscopic examination.  The wire was  then removed with a good curl noted in the kidney as well as in the  bladder.   The patient appeared to tolerate the procedure well and without  complications.  She was able to be transferred to recovery in  satisfactory condition.           ______________________________  Heloise Purpura, MD  Electronically Signed     LB/MEDQ  D:  07/25/2006  T:  07/25/2006  Job:  [id number]   cc:   Maxie Better, M.D.  Fax: [phone]  Richardean Sale, M.D.  Fax: [phone]

## 2010-11-17 NOTE — Op Note (Signed)
NAME:  Christine Walter, Christine Walter              ACCOUNT NO.:  [id number]   MEDICAL RECORD NO.:  [id number]          PATIENT TYPE:  AMB   LOCATION:  NESC                         FACILITY:  [facility]   PHYSICIAN:  Boston Service, M.D.DATE OF BIRTH:  05-Mar-1976   DATE OF PROCEDURE:  05/11/2005  DATE OF DISCHARGE:                                 OPERATIVE REPORT   REFERRED BY:  Loreen Freud, M.D.   Reference is made to office notes from April 24, 2005, March 01, 2005,  Nov 13, 2004, Oct 31, 2004. I have had some difficulty with compliance. The  patient's current double-J stent has been in over 6 months and it has been  difficult for [place] to get the patient back for follow-up.   PREOPERATIVE DIAGNOSIS:  A 35 year old black female ? diabetes ? papillary  necrosis now 6 months status post stent placement for necrotic debris within  the distal right ureter. The patient presents today for double-J stent  removal and ureteroscopy.   POSTOPERATIVE DIAGNOSIS:  Same.   ANESTHESIA:  General.   DRAINS:  6-French 26 cm double-J stent.   DESCRIPTION OF PROCEDURE:  The patient was prepped and draped in the dorsal  lithotomy position after institution of an adequate level of general  anesthesia. A well lubricated 21-French panendoscope was gently inserted at  the urethral meatus, normal urethra and sphincter. Double-J stent protruding  at the right ureteral orifice, normal anatomy at the left orifice. The stent  was heavily encrusted with what appeared to be stony debris. Retrograde  films were performed on the left.   A blocking catheter was inserted at the left ureteral orifice with gentle  injection of 3-5 mL of contrast. The ureter was outlined without filling  defect or obstruction with an additional 3-6 mL of contrast. The pelvis and  calyces were outlined, likewise no evidence of filling defect or anatomic  deformity. Prompt drainage at 3-5 minutes. A floppy-tip guidewire was  inserted alongside the  indwelling double-J stent on the right side, appeared  to advance into what appeared to be dilated calyces with guidewire in place.  The double-J stent was grasped and then gently removed. It was somewhat  difficult to negotiate the stent past the urethra due to heavy encrustation  of the distal pigtail curl.  The stent was eventually removed, retrograde  films showed proximal hydroureter and hydronephrosis possibly due to  obstruction of the stent. The end-hole catheter was advanced over the  guidewire.  Aspiration of probably 20-30 mL of purulent debris from the  pelvis. Urine was sent for culture. Once purulent debris had been aspirated  from the pelvis, there appeared to be a brisk postobstructive diuresis of  minimally cloudy urine. This was allowed to subside over a period of about  10 or 15 minutes. Retrograde films appeared to show dilation of the pelvis  and calyces but no evidence of obstruction. There was some question of a 5  or 6 mm calculus in the region of the right UPJ. For that  reason, a fresh double-J stent, 6-French 26 cm was placed, passed into the  renal  pelvis without difficulty. On guidewire removal, excellent pigtail  formation both proximally and distally. The bladder was drained, cystoscope  was removed. The patient was given a B&O suppository and returned to  recovery in satisfactory condition.           ______________________________  [place] Service, M.D.     RH/MEDQ  D:  05/11/2005  T:  05/11/2005  Job:  [id number]   cc:   Loreen Freud, M.D.  [address] [place]  [place] [address]

## 2010-11-17 NOTE — Consult Note (Signed)
NAME:  YATZIRY, DEAKINS NO.:  [id number]   MEDICAL RECORD NO.:  [id number]          PATIENT TYPE:  EMS   LOCATION:  ED                           FACILITY:  [facility]   PHYSICIAN:  Rozanna Boer., M.D.DATE OF BIRTH:  Oct 21, 1975   DATE OF CONSULTATION:  DATE OF DISCHARGE:                                   CONSULTATION   PHYSICIAN REQUESTING CONSULTATION:  Emergency room physician   REASON FOR CONSULTATION:  Right hydronephrosis, abdominal pain, renal  calculi.   HISTORY OF PRESENT ILLNESS:  This 35 year old patient came to the emergency  room about 12 hours ago with acute right flank and lower abdominal pain,  nausea, vomiting.  It took a fair amount of pain medicine to get her under  control.  Her blood pressure was quite elevated on admission at 168/106 but  has since returned to normal.  Subsequent work up has revealed a white count  of 10,900, hematocrit 36.4, creatinine 0.9. Normal BMAT.  Urinalysis shows  negative for glucose, negative for ketones, negative for nitrites, 0-2 white  cells, 0-2 red cells per high power field, rare bacteria on voided specimen.  She has been afebrile since she has been here.  The pain has gradually  subsided and a CT scan done of the abdomen and pelvis showed a right  hydronephrosis secondary to probable chronic UPJ obstruction, significant  caliectasis versus calicle diverticulum of the right kidney containing  multiple calculi and some perinephric edema of the right kidney.  There is  no distal obstruction noted on either the right or the left but there was  probable collapsed cyst of the right ovary, there is no pelvic mass,  adenopathy or free fluid, no appendicitis.   The patient gives a history that when she was a child she had a damaged  right kidney.  This was never treated per se.  She did have some high blood  pressure before she was pregnant with her son who is a year old but during  her pregnancy the blood  pressure was fine.  She used to see Dr. Blossom Hoops  out of [place] but was dismissed from the practice because of failure to  keep or reschedule appointments.  She has had history of urinary tract  infection but has not had one for over a year since her son was born.  She  never passed kidney stones.  The only operation was a gallbladder done in  July of 2005.  She does not remember anybody telling her about her right  kidney at that time.   ALLERGIES:  No known drug allergies.   CURRENT MEDICATIONS:  None.   SOCIAL HISTORY:  She is married and has a one year old son and works at  [employer] in [place].   FAMILY HISTORY:  No significant family history of diabetes mellitus, kidney  stones or heart disease.   PHYSICAL EXAMINATION:  Her blood pressure now is 138/81, pulse 90,  respirations 20.  She is a pleasant black female, lying quietly in bed in no  acute distress.  HEENT: Clear.  Neck is  supple.  LUNGS: Clear.  ABDOMEN: Soft, no CVA tenderness. Her abdomen is obese and soft without  masses.  PELVIC: Deferred.  EXTREMITIES: Negative, no edema, good distal pulses, intact sensation to  light touch.   IMPRESSION:  1.  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  2.  Right renal calculi also probably chronic.  3.  Acute right flank pain of unknown etiology.  4.  Possible ruptured ovarian cyst.  5.  History of episodic hypertension.   RECOMMENDATIONS:  1.  Now that she is comfortable, I think she can go home with some oxycodone      for pain.  2.  She needs outpatient evaluation including renal scan.  3.  A 24 hour urine for stone risk profile.  4.  Further work up regarding the right kidney which I believe is chronic      and will probably treat conservatively.  5.  The actual cause of the acute pain is not known at this time but she is      improved and since her urine clear, we will not put her on antibiotics      at this time.      HMK/MEDQ  D:   10/28/2004  T:  10/28/2004  Job:  [id number]

## 2010-11-17 NOTE — Op Note (Signed)
NAME:  Christine Walter, NIEBLA NO.:  [id number]   MEDICAL RECORD NO.:  [id number]                   PATIENT TYPE:  OBV   LOCATION:  0278                                 FACILITY:  [facility]   PHYSICIAN:  Ollen Gross. Vernell Morgans, M.D.              DATE OF BIRTH:  October 01, 1975   DATE OF PROCEDURE:  01/28/2004  DATE OF DISCHARGE:  01/29/2004                                 OPERATIVE REPORT   PREOPERATIVE DIAGNOSES:  Gallstones.   POSTOPERATIVE DIAGNOSES:  Gallstones with possible choledocholithiasis.   PROCEDURE:  Laparoscopic cholecystectomy with intraoperative cholangiogram   SURGEON:  Ollen Gross. Carolynne Edouard, M.D.   ASSISTANT:  Angelia Mould. Derrell Lolling, M.D.   ANESTHESIA:  General endotracheal.   DESCRIPTION OF PROCEDURE:  After informed consent was obtained, the patient  was brought to the operating room, placed in the supine position on the  operating room table. After adequate induction of general anesthesia, the  patient's abdomen was prepped with Betadine and draped in the usual sterile  manner. The area below the umbilicus was infiltrated with 0.25% Marcaine, a  small incision was made with a 15 blade knife, this incision was carried  down through the subcutaneous tissue bluntly with a Kelly clamp and [employer]-  [employer] retractors until the linea alba was identified. The linea alba was  incised with a 15 blade knife and each side was grasped with Kocher clamps  and elevated anteriorly. The preperitoneal space was probed bluntly with a  hemostat until the peritoneum was opened and access was gained to the  abdominal cavity. A #0 Vicryl pursestring stitch was then placed in the  fascia surrounding the opening, Hasson cannula was placed through the  opening and anchored in place with the previously placed Vicryl pursestring  stitch. The abdomen was then insufflated with carbon dioxide without  difficulty. The patient was placed in a head up position and rotated  slightly with the right  side up. The right upper quadrant was inspected and  the dome of the gallbladder and liver were readily identified. The  epigastric region was then infiltrated with 0.25% Marcaine, a small incision  was made with a 10 blade knife and the 10 mm port was placed bluntly through  this incision into the abdominal cavity under direct vision. Sites were then  chosen laterally on the right side of the abdomen for placement of 5 mm  ports and each of these areas was infiltrated with 0.25% Marcaine. A small  incision was made with a 15 blade knife, 5 mm ports were then placed bluntly  through these incisions into the abdominal cavity under direct vision. A  blunt grasper was placed through the lateral most 5 mm port and used to  grasp the dome of the gallbladder and elevated it anteriorly and superiorly.  Another blunt grasper was placed through the other 5 mm port and used to  return to the body and  neck of the gallbladder. A dissector was placed  through the epigastric port and using electrocautery, the peritoneal  reflection of the gallbladder neck was opened, blunt dissection was then  carried out in this area until the gallbladder neck, cystic duct junction  was readily identified and a good window was created. A single clip was  placed proximally on the gallbladder neck, a small ductotomy was made below  the clip with the laparoscopic scissors. A 14 gauge angiocatheter was then  placed percutaneously through the anterior abdominal wall under direct  vision and a Reddick cholangiogram catheter was placed through the  angiocatheter and flushed. The Reddick catheter was then placed within the  cystic duct and anchored in place with the clip. A cholangiogram was  obtained that showed one small filling defect low in the common bile duct,  rapid emptying of the duodenum and adequate length of the cystic duct. At  this point, the anchoring clip was removed, the Reddick catheter was able to  be passed  several times down into the duodenum. The balloon was blown up and  gently pulled back until it was able to be retrieved back through the cystic  duct. No debris was able to really be identified. Another anchoring clip was  then placed, another cholangiogram was obtained and again it showed this  same small filling defect. The anchoring clip was then removed again and the  catheter was passed distally into the duodenum again. After several attempts  were made another last cholangiogram was obtained that this time did not  show the same filling defect very well and still had good emptying into the  duodenum.  At this point, the anchoring clip and catheter were removed from  the patient, three clips were placed proximally on the cystic duct and the  duct was divided between the two sets of clips. Posterior to this, the  cystic artery was identified and again dissected bluntly in a  circumferential manner until a good window was created and two clips were  placed proximally and one distally on the artery and the artery was divided  between the two.  Next, a laparoscopic hook cautery device was used to  separate the gallbladder from the liver bed. Prior to completely detaching  the gallbladder from the liver bed, the liver bed was inspected and was  found to be hemostatic. The gallbladder was then detached the rest of the  way from the liver bed without difficulty using the hook electrocautery. The  laparoscope was then removed to the epigastric port, a gallbladder grasper  was placed through the Hasson cannula and used to grasp the neck of the  gallbladder. The gallbladder with the Hasson cannula was removed through the  infraumbilical port without difficulty.  The fascial defect was closed with  the previously placed pursestring stitch as well as with another interrupted  #0 Vicryl stitch.  The abdomen was then irrigated with copious amounts of saline until the effluent was clear. The liver  bed was inspected again and  found to be hemostatic.  The ports were then all removed under direct vision  and the gas was allowed to escape. The skin incisions were all closed with  interrupted 4-0 Monocryl subcuticular stitches, Benzoin and Steri-Strips  were applied.  The patient tolerated the procedure well. At the end of the  case, all sponge, needle and instrument counts were correct.  The patient  was then awakened and taken to the recovery room in stable condition.  Ollen Gross. Vernell Morgans, M.D.    PST/MEDQ  D:  01/30/2004  T:  01/30/2004  Job:  [id number]

## 2010-11-17 NOTE — Discharge Summary (Signed)
NAME:  Christine Walter, Christine Walter              ACCOUNT NO.:  [id number]   MEDICAL RECORD NO.:  [id number]          PATIENT TYPE:  INP   LOCATION:  9157                          FACILITY:  [facility]   PHYSICIAN:  Maxie Better, M.D.DATE OF BIRTH:  Nov 08, 1975   DATE OF ADMISSION:  07/25/2006  DATE OF DISCHARGE:  07/28/2006                               DISCHARGE SUMMARY   ADMISSION DIAGNOSES:  1. Right ureteral obstruction with fever, right hydronephrosis.  2. Chronic hypertension.  3. Intrauterine gestation at 31 weeks and 5 days.   DISCHARGE DIAGNOSES:  1. Right ureteral obstruction resolved status post right ureteral      stent placement.  2. Chronic hypertension.  3. Intrauterine gestation of 32+ weeks, undelivered.   PROCEDURE:  Right stent placement by Dr. Laverle Patter.   HOSPITAL COURSE:  The patient is a 35 year old gravida 3, para 1, 0, 1,  1, 1, married black female with known chronic hypertension on Procardia  XL who presented at [facility]  at 31-5/[redacted] weeks gestation with  right flank pain and temperature to 101.5. The patient underwent  ultrasound that revealed a right hydronephrosis with ureteral  obstruction. Urologic consultation was obtained with Dr. Laverle Patter and the  patient was subsequently taken to the operating room where she underwent  a cystoscopy and right ureteral stent placement. Due to the fever the  patient was placed on IV antibiotics. She was given Dilaudid for pain  management. The patient's antibiotics of ampicillin and gentamicin was  continued until she defervesced. Her pain had markedly  improved/resolved. Tracing had good variability, her CVA tenderness had  resolved. Urine culture had  no significant growth. Blood  cultures were  negative. The patient was feeling better by postoperative day number 3  and was deemed well to be discharged home.   DISPOSITION:  Home.   CONDITION ON DISCHARGE:  Stable.   DISCHARGE MEDICATIONS:  1. Vicodin 1-2 tablets q.4-6  hours p.r.n. pain.  2. Keflex 500 mg q.6 hours.  3. Procardia XL as previously prescribed.   FOLLOW UP:  [facility] in 2 days and with Dr. Laverle Patter after  completion of antibiotic regimen.   DISCHARGE INSTRUCTIONS:  Call for temperature greater than or equal to  100.4, with recurrence of her back pain, decreased fetal movement,  vaginal bleeding, premature contractions.      Maxie Better, M.D.  Electronically Signed     [place]/MEDQ  D:  08/25/2006  T:  08/25/2006  Job:  [id number]

## 2011-03-21 ENCOUNTER — Encounter: Payer: Self-pay | Admitting: Family

## 2011-03-21 ENCOUNTER — Telehealth: Payer: Self-pay | Admitting: Family

## 2011-03-21 ENCOUNTER — Ambulatory Visit (INDEPENDENT_AMBULATORY_CARE_PROVIDER_SITE_OTHER): Payer: Self-pay | Admitting: Family

## 2011-03-21 ENCOUNTER — Ambulatory Visit (HOSPITAL_BASED_OUTPATIENT_CLINIC_OR_DEPARTMENT_OTHER)
Admission: RE | Admit: 2011-03-21 | Discharge: 2011-03-21 | Disposition: A | Discharge status: Home / Self Care | Payer: BC Managed Care – PPO | Source: Ambulatory Visit | Attending: Family | Admitting: Family

## 2011-03-21 VITALS — BP 158/108 | HR 78 | Temp 97.9°F | Resp 16 | Wt 196.0 lb

## 2011-03-21 DIAGNOSIS — R05 Cough: Secondary | ICD-10-CM

## 2011-03-21 DIAGNOSIS — R059 Cough, unspecified: Secondary | ICD-10-CM | POA: Insufficient documentation

## 2011-03-21 DIAGNOSIS — J329 Chronic sinusitis, unspecified: Secondary | ICD-10-CM

## 2011-03-21 DIAGNOSIS — I10 Essential (primary) hypertension: Secondary | ICD-10-CM

## 2011-03-21 MED ORDER — AMOXICILLIN 500 MG PO CAPS: 1000.0000 mg | ORAL_CAPSULE | Freq: Three times a day (TID) | ORAL | Status: AC

## 2011-03-21 MED ORDER — LORATADINE 10 MG PO TABS: 10.0000 mg | ORAL_TABLET | Freq: Every day | ORAL | Status: DC

## 2011-03-21 MED ORDER — AMLODIPINE BESYLATE 5 MG PO TABS: 5.0000 mg | ORAL_TABLET | Freq: Every day | ORAL | Status: DC

## 2011-03-21 NOTE — Patient Instructions (Signed)
Please complete your chest x-ray on the first floor. Follow up with Dr. Laury Axon in 1 month. Call if fever over 101, if symptoms worsen or if you are not feeling better in 2-3 days.

## 2011-03-21 NOTE — Telephone Encounter (Signed)
Call placed to [phone], patient was informed per Sandford Craze instructions, and has verbalized understanding.

## 2011-03-21 NOTE — Telephone Encounter (Signed)
Pls call pt and let her know that her chest x-ray is negative and that I have sent a 10 day supply of amoxicillin to her pharmacy.

## 2011-03-21 NOTE — Assessment & Plan Note (Addendum)
Deteriorated.  Will plan to start amlodipine.  Pt to follow up in 1 month.    BP Readings from Last 3 Encounters:  03/21/11 158/108  10/20/07 140/90  05/27/07 128/90

## 2011-03-21 NOTE — Assessment & Plan Note (Signed)
CXR negative.  Will plan to treat for sinusitis with amoxicillin.

## 2011-03-21 NOTE — Progress Notes (Signed)
  Subjective:    Patient ID: Christine Walter, female    DOB: [birth year], 35 y.o.   MRN: [id number]  HPI  Christine Walter is a 35 yr old female who presents with chief complaint of cough.  Started 4 weeks ago and is productive.  Mucous has changed from clear to green the last 4-5 days.  Denies associated hemoptysis.  Cough is worse at night.  + associate post-nasal drip.  She has tried multiple OTC agents without improvement. She reports + fever the first week.  Denies ear pain, or current sore throat (was sore first week).    HTN-  She has been on medication in the past but that she "weaned herself off of it 4 yrs ago."    Review of Systems  HENT: Positive for sinus pressure. Negative for rhinorrhea and sneezing.        + frontal and maxillary sinus pressure.  Respiratory: Positive for wheezing. Negative for shortness of breath.   Musculoskeletal: Negative for myalgias.   No past medical history on file.  History   Social History  . Marital Status: Married    Spouse Name: N/A    Number of Children: N/A  . Years of Education: N/A   Occupational History  . Not on file.   Social History Main Topics  . Smoking status: Never Smoker   . Smokeless tobacco: Never Used  . Alcohol Use: Not on file  . Drug Use: Not on file  . Sexually Active: Not on file   Other Topics Concern  . Not on file   Social History Narrative  . No narrative on file    No past surgical history on file.  No family history on file.  No Known Allergies  No current outpatient prescriptions on file prior to visit.    BP 158/108  Pulse 78  Temp(Src) 97.9 F (36.6 C) (Oral)  Resp 16  Wt 196 lb 0.6 oz (88.923 kg)  SpO2 100%  LMP 03/01/2011        Objective:   Physical Exam  Constitutional: She appears well-developed and well-nourished.  HENT:  Head: Normocephalic and atraumatic.  Right Ear: Tympanic membrane and ear canal normal.  Left Ear: Tympanic membrane and ear canal normal.    Mouth/Throat: Uvula is midline and mucous membranes are normal.       Mild pharyngeal erythema without exudates.  Eyes: Conjunctivae are normal. No scleral icterus.  Cardiovascular: Normal rate and regular rhythm.   No murmur heard. Pulmonary/Chest: Breath sounds normal. No respiratory distress. She has no wheezes. She has no rales.  Abdominal: Bowel sounds are normal.  Skin: Skin is warm and dry.  Psychiatric: She has a normal mood and affect. Her behavior is normal. Judgment and thought content normal.          Assessment & Plan:

## 2011-04-16 ENCOUNTER — Ambulatory Visit: Payer: Self-pay | Admitting: Family

## 2011-04-16 DIAGNOSIS — Z0289 Encounter for other administrative examinations: Secondary | ICD-10-CM

## 2011-04-19 LAB — HEMOGLOBIN AND HEMATOCRIT, BLOOD
HCT: 40.4
Hemoglobin: 13

## 2011-04-19 LAB — BASIC METABOLIC PANEL
BUN: 11
CO2: 27
Calcium: 9.1
Chloride: 103
Creatinine, Ser: 0.83
GFR calc Af Amer: >60
GFR calc non Af Amer: >60
Glucose, Bld: 100 — ABNORMAL HIGH
Potassium: 4
Sodium: 137

## 2011-04-19 LAB — PREGNANCY, URINE: Preg Test, Ur: NEGATIVE

## 2011-07-06 ENCOUNTER — Other Ambulatory Visit: Payer: Self-pay | Admitting: Family Medicine

## 2011-07-06 ENCOUNTER — Ambulatory Visit (INDEPENDENT_AMBULATORY_CARE_PROVIDER_SITE_OTHER): Payer: BC Managed Care – PPO | Admitting: Internal Medicine

## 2011-07-06 VITALS — BP 170/120 | HR 80 | Temp 98.8°F | Wt 204.0 lb

## 2011-07-06 DIAGNOSIS — I10 Essential (primary) hypertension: Secondary | ICD-10-CM

## 2011-07-06 LAB — BASIC METABOLIC PANEL
BUN: 10 mg/dL (ref 6–23)
CO2: 21 mEq/L (ref 19–32)
Chloride: 105 mEq/L (ref 96–112)
Creat: 0.94 mg/dL (ref 0.50–1.10)
Glucose, Bld: 79 mg/dL (ref 70–99)
Potassium: 4.1 mEq/L (ref 3.5–5.3)
Sodium: 139 mEq/L (ref 135–145)

## 2011-07-06 LAB — CBC WITH DIFFERENTIAL/PLATELET
Basophils Absolute: 0 10*3/uL (ref 0.0–0.1)
Basophils Relative: 0 % (ref 0–1)
Eosinophils Absolute: 0.1 10*3/uL (ref 0.0–0.7)
Eosinophils Relative: 1 % (ref 0–5)
HCT: 39.7 % (ref 36.0–46.0)
Hemoglobin: 12.8 g/dL (ref 12.0–15.0)
Lymphocytes Relative: 33 % (ref 12–46)
Lymphs Abs: 1.7 10*3/uL (ref 0.7–4.0)
MCH: 27.9 pg (ref 26.0–34.0)
MCV: 86.5 fL (ref 78.0–100.0)
Monocytes Absolute: 0.6 10*3/uL (ref 0.1–1.0)
Monocytes Relative: 12 % (ref 3–12)
Neutrophils Relative %: 54 % (ref 43–77)
RBC: 4.59 MIL/uL (ref 3.87–5.11)
RDW: 12.7 % (ref 11.5–15.5)
WBC: 5.1 10*3/uL (ref 4.0–10.5)

## 2011-07-06 MED ORDER — AMLODIPINE BESYLATE 10 MG PO TABS: 10.0000 mg | ORAL_TABLET | Freq: Every day | ORAL | Status: DC

## 2011-07-06 NOTE — Assessment & Plan Note (Addendum)
Seen  here today with quite elevated BP, fortunately she seems to be asymptomatic. Risk of uncontrolled hypertension discussed with the patient, risks include CAD, strokes and renal failure. 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 Plan: Labs Restart amlodipine, will prescribe this time 10 mg. Needs a check up within 3 weeks. Today , I spent more than 25 min with the patient, >50% of the time counseling,   reviewing the chart

## 2011-07-06 NOTE — Progress Notes (Signed)
  Subjective:    Patient ID: Christine Walter, female    DOB: 05/07/1976, 36 y.o.   MRN: [id number]  HPI Acute visit Her main concern is that she ran out of BP meds.  She was seen 03-2011, BP was elevated, was prescribed amlodipine 5 mg which she took without side effects. BP was pretty good but according to the patient while she was taking a medication but she ran out of of meds October 2012. Today, BP is 170/120, this is the highest she has seen her BP, usually diastolic blood pressure is in the 90s  Past medical history BC: IUD Hypertension "Athrophic R kidney" Urolithiasis  PSH Cholecystectomy  SH No tobacco  Review of Systems No chest pain or shortness of breath No lower extremity edema Occasional mild headache.     Objective:   Physical Exam  Constitutional: She appears well-developed. No distress.  Cardiovascular: Normal rate, regular rhythm and normal heart sounds.   No murmur heard. Pulmonary/Chest: Effort normal. No respiratory distress. She has no wheezes. She has no rales.  Musculoskeletal: She exhibits no edema.  Neurological:       Speech, gait and motor are normal  Skin: She is not diaphoretic.      Assessment & Plan:

## 2011-07-08 ENCOUNTER — Encounter: Payer: Self-pay | Admitting: Internal Medicine

## 2011-07-09 ENCOUNTER — Telehealth: Payer: Self-pay | Admitting: *Deleted

## 2011-07-09 NOTE — Telephone Encounter (Signed)
Message copied by Verdene Rio on Mon Jul 09, 2011  4:20 PM ------      Message from: Willow Ora E      Created: Sun Jul 08, 2011  2:09 PM       Please check on the patient, is her blood pressure better?

## 2011-07-09 NOTE — Telephone Encounter (Signed)
Left message to call office

## 2011-07-11 ENCOUNTER — Ambulatory Visit: Payer: BC Managed Care – PPO | Admitting: Family Medicine

## 2011-07-11 NOTE — Telephone Encounter (Signed)
I spoke w/pt - she is c/o swelling in hands and feet since starting med along w/slight h/a. I advised pt to check BP once a day if possible and report back to the office. She will be calling back w/reading today.

## 2011-07-12 ENCOUNTER — Telehealth: Payer: Self-pay | Admitting: *Deleted

## 2011-07-12 NOTE — Telephone Encounter (Signed)
Message copied by Verdene Rio on Thu Jul 12, 2011  5:11 PM ------      Message from: Willow Ora E      Created: Sun Jul 08, 2011  2:09 PM       Please check on the patient, is her blood pressure better?

## 2011-07-12 NOTE — Telephone Encounter (Signed)
Left message to call office

## 2011-07-12 NOTE — Telephone Encounter (Signed)
Swelling may be d/t amlodipine Plan: Decrease amlodipine 10 mg to 1/2 tab a day Losartan 50 mg 1 po qd #30, no RF F/u within 3 weeks Call if s/e

## 2011-07-13 MED ORDER — LOSARTAN POTASSIUM 50 MG PO TABS: 50.0000 mg | ORAL_TABLET | Freq: Every day | ORAL | Status: DC

## 2011-07-13 NOTE — Telephone Encounter (Signed)
Discuss with patient, Rx sent. 

## 2011-07-26 NOTE — Telephone Encounter (Signed)
Left message to call office, Pt has pending OV with PCP on tomorrow for f/u.

## 2011-07-27 ENCOUNTER — Ambulatory Visit: Payer: BC Managed Care – PPO | Admitting: Family Medicine

## 2011-08-01 NOTE — Telephone Encounter (Signed)
Left message to call office Pt has reschedule OV until Friday 08-03-11.

## 2011-08-03 ENCOUNTER — Ambulatory Visit (INDEPENDENT_AMBULATORY_CARE_PROVIDER_SITE_OTHER): Payer: BC Managed Care – PPO | Admitting: Family Medicine

## 2011-08-03 ENCOUNTER — Encounter: Payer: Self-pay | Admitting: Family Medicine

## 2011-08-03 VITALS — BP 138/94 | HR 84 | Temp 98.5°F | Ht 65.0 in | Wt 200.8 lb

## 2011-08-03 DIAGNOSIS — Z23 Encounter for immunization: Secondary | ICD-10-CM

## 2011-08-03 DIAGNOSIS — I10 Essential (primary) hypertension: Secondary | ICD-10-CM

## 2011-08-03 MED ORDER — LOSARTAN POTASSIUM 100 MG PO TABS: 100.0000 mg | ORAL_TABLET | Freq: Every day | ORAL | Status: DC

## 2011-08-03 NOTE — Progress Notes (Signed)
  Subjective:    Patient here for follow-up of elevated blood pressure.  She is not exercising and is adherent to a low-salt diet.  Blood pressure is not well controlled at home. Cardiac symptoms: none. Patient denies: chest pain, chest pressure/discomfort, claudication, dyspnea, exertional chest pressure/discomfort, fatigue, irregular heart beat, lower extremity edema, near-syncope, orthopnea, palpitations, paroxysmal nocturnal dyspnea, syncope and tachypnea. Cardiovascular risk factors: hypertension and sedentary lifestyle. Use of agents associated with hypertension: none. History of target organ damage: none.  The following portions of the patient's history were reviewed and updated as appropriate: allergies, current medications, past family history, past medical history, past social history, past surgical history and problem list.  Review of Systems Pertinent items are noted in HPI.     Objective:    BP 138/94  Pulse 84  Temp(Src) 98.5 F (36.9 C) (Oral)  Ht 5\' 5"  (1.651 m)  Wt 200 lb 12.8 oz (91.082 kg)  BMI 33.41 kg/m2  SpO2 99% General appearance: alert, cooperative, appears stated age and no distress Lungs: clear to auscultation bilaterally Heart: S1, S2 normal Extremities: extremities normal, atraumatic, no cyanosis or edema    Assessment:    Hypertension, stage 1 . Evidence of target organ damage: none.    Plan:    Medication: increase to losartan 100. Dietary sodium restriction. Regular aerobic exercise. Follow up: 2 weeks and as needed.

## 2011-08-03 NOTE — Patient Instructions (Signed)

## 2011-08-08 ENCOUNTER — Encounter: Payer: Self-pay | Admitting: *Deleted

## 2011-08-08 NOTE — Telephone Encounter (Signed)
Left message to call office. Pt has now reschedule appt to 08-24-11. Letter Mail to have Pt contact office

## 2011-08-24 ENCOUNTER — Ambulatory Visit: Payer: BC Managed Care – PPO | Admitting: Family Medicine

## 2011-08-24 DIAGNOSIS — Z0289 Encounter for other administrative examinations: Secondary | ICD-10-CM

## 2012-11-09 ENCOUNTER — Other Ambulatory Visit: Payer: Self-pay | Admitting: Family Medicine

## 2012-11-12 ENCOUNTER — Encounter: Payer: Self-pay | Admitting: Family Medicine

## 2012-11-12 ENCOUNTER — Ambulatory Visit (INDEPENDENT_AMBULATORY_CARE_PROVIDER_SITE_OTHER): Payer: BC Managed Care – PPO | Admitting: Family Medicine

## 2012-11-12 VITALS — BP 180/110 | HR 80 | Temp 98.4°F | Wt 223.0 lb

## 2012-11-12 DIAGNOSIS — I10 Essential (primary) hypertension: Secondary | ICD-10-CM

## 2012-11-12 DIAGNOSIS — R609 Edema, unspecified: Secondary | ICD-10-CM

## 2012-11-12 MED ORDER — LOSARTAN POTASSIUM-HCTZ 100-12.5 MG PO TABS: 1.0000 | ORAL_TABLET | Freq: Every day | ORAL | Status: DC

## 2012-11-12 NOTE — Progress Notes (Signed)
  Subjective:    Patient here for follow-up of elevated blood pressure.  She is not exercising and is adherent to a low-salt diet.  Blood pressure is not well controlled at home. Cardiac symptoms: lower extremity edema. Patient denies: chest pain, chest pressure/discomfort, claudication, dyspnea, exertional chest pressure/discomfort, fatigue, irregular heart beat, near-syncope, orthopnea, palpitations, paroxysmal nocturnal dyspnea, syncope and tachypnea. Cardiovascular risk factors: hypertension, obesity (BMI >= 30 kg/m2) and sedentary lifestyle. Use of agents associated with hypertension: none. History of target organ damage: none.  Pt has been off meds more than a week.     The following portions of the patient's history were reviewed and updated as appropriate: allergies, current medications, past family history, past medical history, past social history, past surgical history and problem list.  Review of Systems Pertinent items are noted in HPI.     Objective:    BP 180/110  Pulse 80  Temp(Src) 98.4 F (36.9 C) (Oral)  Wt 223 lb (101.152 kg)  BMI 37.11 kg/m2  SpO2 99% General appearance: alert, cooperative, appears stated age and no distress Neck: no adenopathy, supple, symmetrical, trachea midline and thyroid not enlarged, symmetric, no tenderness/mass/nodules Lungs: clear to auscultation bilaterally Heart: S1, S2 normal Extremities: edema tr pitting b/l low ext   ekg:  NSR  Assessment:    Hypertension, . Evidence of target organ damage: none.    Plan:    Medication: begin hyzaar . Screening labs for initial evaluation: basic metabolic panel, lipid panel and urinalysis. Dietary sodium restriction. Regular aerobic exercise. Check blood pressures 2-3 times weekly and record. Follow up: 2 weeks and as needed.

## 2012-11-12 NOTE — Patient Instructions (Signed)

## 2012-11-13 LAB — BASIC METABOLIC PANEL
BUN: 11 mg/dL (ref 6–23)
CO2: 27 mEq/L (ref 19–32)
Calcium: 8.8 mg/dL (ref 8.4–10.5)
Creatinine, Ser: 1 mg/dL (ref 0.4–1.2)
GFR: 78.6 mL/min (ref >60.00)
Glucose, Bld: 79 mg/dL (ref 70–99)

## 2012-11-13 LAB — HEPATIC FUNCTION PANEL
Albumin: 4 g/dL (ref 3.5–5.2)
Alkaline Phosphatase: 85 U/L (ref 39–117)
Bilirubin, Direct: 0.2 mg/dL (ref 0.0–0.3)
Total Bilirubin: 0.8 mg/dL (ref 0.3–1.2)

## 2012-11-13 LAB — CBC WITH DIFFERENTIAL/PLATELET
Basophils Absolute: 0 10*3/uL (ref 0.0–0.1)
Eosinophils Absolute: 0.1 10*3/uL (ref 0.0–0.7)
Lymphocytes Relative: 33.6 % (ref 12.0–46.0)
MCHC: 33.3 g/dL (ref 30.0–36.0)
Neutro Abs: 3.3 10*3/uL (ref 1.4–7.7)
Neutrophils Relative %: 55.9 % (ref 43.0–77.0)
Platelets: 215 10*3/uL (ref 150.0–400.0)
RDW: 13.3 % (ref 11.5–14.6)

## 2012-11-13 LAB — POCT URINALYSIS DIPSTICK
Ketones, UA: NEGATIVE
Leukocytes, UA: NEGATIVE
Protein, UA: NEGATIVE
pH, UA: 6.5

## 2012-11-13 LAB — LIPID PANEL
HDL: 46.9 mg/dL (ref >39.00)
LDL Cholesterol: 133 mg/dL — ABNORMAL HIGH (ref 0–99)
VLDL: 12.4 mg/dL (ref 0.0–40.0)

## 2012-11-14 LAB — MICROALBUMIN / CREATININE URINE RATIO: Microalb Creat Ratio: 0.3 mg/g (ref 0.0–30.0)

## 2013-05-07 ENCOUNTER — Other Ambulatory Visit: Payer: Self-pay

## 2013-05-19 ENCOUNTER — Other Ambulatory Visit: Payer: Self-pay

## 2013-05-19 DIAGNOSIS — I10 Essential (primary) hypertension: Secondary | ICD-10-CM

## 2013-05-19 DIAGNOSIS — R609 Edema, unspecified: Secondary | ICD-10-CM

## 2013-05-19 MED ORDER — LOSARTAN POTASSIUM-HCTZ 100-12.5 MG PO TABS: ORAL_TABLET | ORAL | Status: DC

## 2013-12-02 ENCOUNTER — Other Ambulatory Visit: Payer: Self-pay | Admitting: Family Medicine

## 2013-12-03 NOTE — Telephone Encounter (Signed)
Rx sent to the pharmacy by e-script.//AB/CMA 

## 2014-08-03 ENCOUNTER — Encounter: Payer: Self-pay | Admitting: Family Medicine

## 2014-08-03 ENCOUNTER — Ambulatory Visit (INDEPENDENT_AMBULATORY_CARE_PROVIDER_SITE_OTHER): Payer: BC Managed Care – PPO | Admitting: Family Medicine

## 2014-08-03 VITALS — BP 124/88 | HR 88 | Temp 98.4°F | Resp 16 | Wt 212.4 lb

## 2014-08-03 DIAGNOSIS — J069 Acute upper respiratory infection, unspecified: Secondary | ICD-10-CM

## 2014-08-03 DIAGNOSIS — R05 Cough: Secondary | ICD-10-CM

## 2014-08-03 DIAGNOSIS — R059 Cough, unspecified: Secondary | ICD-10-CM

## 2014-08-03 DIAGNOSIS — J029 Acute pharyngitis, unspecified: Secondary | ICD-10-CM

## 2014-08-03 MED ORDER — FLUTICASONE PROPIONATE 50 MCG/ACT NA SUSP: 2.0000 | Freq: Every day | NASAL | Status: DC

## 2014-08-03 MED ORDER — LORATADINE 10 MG PO TABS: 10.0000 mg | ORAL_TABLET | Freq: Every day | ORAL | Status: DC

## 2014-08-03 MED ORDER — GUAIFENESIN-CODEINE 100-10 MG/5ML PO SYRP: 5.0000 mL | ORAL_SOLUTION | Freq: Three times a day (TID) | ORAL | Status: DC | PRN

## 2014-08-03 NOTE — Progress Notes (Signed)
Pre visit review using our clinic review tool, if applicable. No additional management support is needed unless otherwise documented below in the visit note. 

## 2014-08-03 NOTE — Progress Notes (Signed)
  Subjective:     Christine Walter is a 39 y.o. female who presents for evaluation of symptoms of a URI. Symptoms include congestion, no  fever, non productive cough, post nasal drip, sneezing, sore throat and chills. Onset of symptoms was 2 days ago, and has been gradually worsening since that time. Treatment to date: antihistamines and cough suppressants.  The following portions of the patient's history were reviewed and updated as appropriate:  She  has no past medical history on file. She  does not have any pertinent problems on file. She  has no past surgical history on file. Her family history is not on file. She  reports that she has never smoked. She has never used smokeless tobacco. Her alcohol and drug histories are not on file. She has a current medication list which includes the following prescription(s): loratadine and losartan-hydrochlorothiazide. Current Outpatient Prescriptions on File Prior to Visit  Medication Sig Dispense Refill  . loratadine (CLARITIN) 10 MG tablet Take 1 tablet (10 mg total) by mouth daily.  0  . losartan-hydrochlorothiazide (HYZAAR) 100-12.5 MG per tablet TAKE 1 TABLET BY MOUTH ONCE DAILY. 90 tablet 1   No current facility-administered medications on file prior to visit.   She has No Known Allergies..  Review of Systems Pertinent items are noted in HPI.   Objective:    BP 124/88 mmHg  Pulse 88  Temp(Src) 98.4 F (36.9 C) (Oral)  Resp 16  Wt 212 lb 6 oz (96.333 kg)  SpO2 99%  LMP 08/03/2014 General appearance: alert, cooperative, appears stated age and no distress Ears: normal TM's and external ear canals both ears Nose: clear discharge, mild congestion, turbinates red, swollen, no sinus tenderness Throat: lips, mucosa, and tongue normal; teeth and gums normal Neck: no adenopathy, supple, symmetrical, trachea midline and thyroid not enlarged, symmetric, no tenderness/mass/nodules Lungs: clear to auscultation bilaterally Heart: S1, S2  normal Extremities: extremities normal, atraumatic, no cyanosis or edema   Assessment:    viral upper respiratory illness   Plan:    Discussed diagnosis and treatment of URI. Suggested symptomatic OTC remedies. Nasal saline spray for congestion. Nasal steroids per orders. Follow up as needed.   Cough med per orders

## 2014-08-03 NOTE — Patient Instructions (Signed)
Upper Respiratory Infection, Adult An upper respiratory infection (URI) is also sometimes known as the common cold. The upper respiratory tract includes the nose, sinuses, throat, trachea, and bronchi. Bronchi are the airways leading to the lungs. Most people improve within 1 week, but symptoms can last up to 2 weeks. A residual cough may last even longer.  CAUSES Many different viruses can infect the tissues lining the upper respiratory tract. The tissues become irritated and inflamed and often become very moist. Mucus production is also common. A cold is contagious. You can easily spread the virus to others by oral contact. This includes kissing, sharing a glass, coughing, or sneezing. Touching your mouth or nose and then touching a surface, which is then touched by another person, can also spread the virus. SYMPTOMS  Symptoms typically develop 1 to 3 days after you come in contact with a cold virus. Symptoms vary from person to person. They may include:  Runny nose.  Sneezing.  Nasal congestion.  Sinus irritation.  Sore throat.  Loss of voice (laryngitis).  Cough.  Fatigue.  Muscle aches.  Loss of appetite.  Headache.  Low-grade fever. DIAGNOSIS  You might diagnose your own cold based on familiar symptoms, since most people get a cold 2 to 3 times a year. Your caregiver can confirm this based on your exam. Most importantly, your caregiver can check that your symptoms are not due to another disease such as strep throat, sinusitis, pneumonia, asthma, or epiglottitis. Blood tests, throat tests, and X-rays are not necessary to diagnose a common cold, but they may sometimes be helpful in excluding other more serious diseases. Your caregiver will decide if any further tests are required. RISKS AND COMPLICATIONS  You may be at risk for a more severe case of the common cold if you smoke cigarettes, have chronic heart disease (such as heart failure) or lung disease (such as asthma), or if  you have a weakened immune system. The very young and very old are also at risk for more serious infections. Bacterial sinusitis, middle ear infections, and bacterial pneumonia can complicate the common cold. The common cold can worsen asthma and chronic obstructive pulmonary disease (COPD). Sometimes, these complications can require emergency medical care and may be life-threatening. PREVENTION  The best way to protect against getting a cold is to practice good hygiene. Avoid oral or hand contact with people with cold symptoms. Wash your hands often if contact occurs. There is no clear evidence that vitamin C, vitamin E, echinacea, or exercise reduces the chance of developing a cold. However, it is always recommended to get plenty of rest and practice good nutrition. TREATMENT  Treatment is directed at relieving symptoms. There is no cure. Antibiotics are not effective, because the infection is caused by a virus, not by bacteria. Treatment may include:  Increased fluid intake. Sports drinks offer valuable electrolytes, sugars, and fluids.  Breathing heated mist or steam (vaporizer or shower).  Eating chicken soup or other clear broths, and maintaining good nutrition.  Getting plenty of rest.  Using gargles or lozenges for comfort.  Controlling fevers with ibuprofen or acetaminophen as directed by your caregiver.  Increasing usage of your inhaler if you have asthma. Zinc gel and zinc lozenges, taken in the first 24 hours of the common cold, can shorten the duration and lessen the severity of symptoms. Pain medicines may help with fever, muscle aches, and throat pain. A variety of non-prescription medicines are available to treat congestion and runny nose. Your caregiver   can make recommendations and may suggest nasal or lung inhalers for other symptoms.  HOME CARE INSTRUCTIONS   Only take over-the-counter or prescription medicines for pain, discomfort, or fever as directed by your  caregiver.  Use a warm mist humidifier or inhale steam from a shower to increase air moisture. This may keep secretions moist and make it easier to breathe.  Drink enough water and fluids to keep your urine clear or pale yellow.  Rest as needed.  Return to work when your temperature has returned to normal or as your caregiver advises. You may need to stay home longer to avoid infecting others. You can also use a face mask and careful hand washing to prevent spread of the virus. SEEK MEDICAL CARE IF:   After the first few days, you feel you are getting worse rather than better.  You need your caregiver's advice about medicines to control symptoms.  You develop chills, worsening shortness of breath, or brown or red sputum. These may be signs of pneumonia.  You develop yellow or brown nasal discharge or pain in the face, especially when you bend forward. These may be signs of sinusitis.  You develop a fever, swollen neck glands, pain with swallowing, or white areas in the back of your throat. These may be signs of strep throat. SEEK IMMEDIATE MEDICAL CARE IF:   You have a fever.  You develop severe or persistent headache, ear pain, sinus pain, or chest pain.  You develop wheezing, a prolonged cough, cough up blood, or have a change in your usual mucus (if you have chronic lung disease).  You develop sore muscles or a stiff neck. Document Released: 12/12/2000 Document Revised: 09/10/2011 Document Reviewed: 09/23/2013 [employer] Patient Information 2015 [employer]. This information is not intended to replace advice given to you by your health care provider. Make sure you discuss any questions you have with your health care provider.  

## 2014-08-05 LAB — CULTURE, GROUP A STREP: Organism ID, Bacteria: NORMAL

## 2014-08-20 ENCOUNTER — Telehealth: Payer: Self-pay | Admitting: Family Medicine

## 2014-08-20 MED ORDER — LORATADINE 10 MG PO TABS: 10.0000 mg | ORAL_TABLET | Freq: Every day | ORAL | Status: DC

## 2014-08-20 NOTE — Telephone Encounter (Signed)
Rx faxed.    KP 

## 2014-08-20 NOTE — Telephone Encounter (Signed)
Caller name: Mindi CurlingWilliams, [name] Relation to pt: self  Call back number: [phone] [facility] [place] [address] [id number] [phone]) [phone] (Fax)         Reason for call:  Pt requesting a refill loratadine (CLARITIN) 10 MG tablet

## 2014-08-24 ENCOUNTER — Other Ambulatory Visit: Payer: Self-pay

## 2014-08-24 MED ORDER — LOSARTAN POTASSIUM-HCTZ 100-12.5 MG PO TABS: ORAL_TABLET | ORAL | Status: DC

## 2015-06-08 ENCOUNTER — Other Ambulatory Visit: Payer: Self-pay | Admitting: Family Medicine

## 2015-06-09 NOTE — Telephone Encounter (Signed)
Last filled:  08/24/14 Amt: 90, 1 Last OV:  08/03/14  Med filled x 30 days.

## 2015-07-27 ENCOUNTER — Telehealth: Payer: Self-pay | Admitting: Family Medicine

## 2015-07-27 NOTE — Telephone Encounter (Signed)
Caller name: Self  Can be reached: [phone] ([phone]  Pharmacy:  Reason for call: losartan-hydrochlorothiazide (HYZAAR) 100-12.5 MG tablet [[id number]]

## 2015-07-29 ENCOUNTER — Encounter: Payer: Self-pay | Admitting: Family Medicine

## 2015-07-29 ENCOUNTER — Ambulatory Visit (INDEPENDENT_AMBULATORY_CARE_PROVIDER_SITE_OTHER): Payer: BC Managed Care – PPO | Admitting: Family Medicine

## 2015-07-29 VITALS — BP 140/110 | HR 66 | Temp 98.9°F | Wt 234.6 lb

## 2015-07-29 DIAGNOSIS — I10 Essential (primary) hypertension: Secondary | ICD-10-CM

## 2015-07-29 DIAGNOSIS — Z114 Encounter for screening for human immunodeficiency virus [HIV]: Secondary | ICD-10-CM

## 2015-07-29 LAB — LIPID PANEL
CHOLESTEROL: 196 mg/dL (ref 0–200)
HDL: 44.8 mg/dL (ref >39.00)
LDL CALC: 137 mg/dL — AB (ref 0–99)
NonHDL: 151.43
Total CHOL/HDL Ratio: 4
Triglycerides: 70 mg/dL (ref 0.0–149.0)
VLDL: 14 mg/dL (ref 0.0–40.0)

## 2015-07-29 LAB — COMPREHENSIVE METABOLIC PANEL
ALBUMIN: 4.1 g/dL (ref 3.5–5.2)
ALK PHOS: 85 U/L (ref 39–117)
ALT: 12 U/L (ref 0–35)
AST: 14 U/L (ref 0–37)
BUN: 13 mg/dL (ref 6–23)
CALCIUM: 9.1 mg/dL (ref 8.4–10.5)
CHLORIDE: 105 meq/L (ref 96–112)
CO2: 30 mEq/L (ref 19–32)
Creatinine, Ser: 1.08 mg/dL (ref 0.40–1.20)
GFR: 73.29 mL/min (ref >60.00)
Glucose, Bld: 86 mg/dL (ref 70–99)
POTASSIUM: 4.2 meq/L (ref 3.5–5.1)
Sodium: 140 mEq/L (ref 135–145)
TOTAL PROTEIN: 7.3 g/dL (ref 6.0–8.3)
Total Bilirubin: 0.3 mg/dL (ref 0.2–1.2)

## 2015-07-29 MED ORDER — LOSARTAN POTASSIUM-HCTZ 100-12.5 MG PO TABS: 1.0000 | ORAL_TABLET | Freq: Every day | ORAL | Status: DC

## 2015-07-29 NOTE — Progress Notes (Signed)
Pre visit review using our clinic review tool, if applicable. No additional management support is needed unless otherwise documented below in the visit note. 

## 2015-07-29 NOTE — Assessment & Plan Note (Signed)
Pt was off meds for 1 week Restart hyzaar rto 2 -3 weeks or sooner prn

## 2015-07-29 NOTE — Patient Instructions (Signed)
Hypertension Hypertension, commonly called high blood pressure, is when the force of blood pumping through your arteries is too strong. Your arteries are the blood vessels that carry blood from your heart throughout your body. A blood pressure reading consists of a higher number over a lower number, such as 110/72. The higher number (systolic) is the pressure inside your arteries when your heart pumps. The lower number (diastolic) is the pressure inside your arteries when your heart relaxes. Ideally you want your blood pressure below 120/80. Hypertension forces your heart to work harder to pump blood. Your arteries may become narrow or stiff. Having untreated or uncontrolled hypertension can cause heart attack, stroke, kidney disease, and other problems. RISK FACTORS Some risk factors for high blood pressure are controllable. Others are not.  Risk factors you cannot control include:   Race. You may be at higher risk if you are African American.  Age. Risk increases with age.  Gender. Men are at higher risk than women before age 45 years. After age 65, women are at higher risk than men. Risk factors you can control include:  Not getting enough exercise or physical activity.  Being overweight.  Getting too much fat, sugar, calories, or salt in your diet.  Drinking too much alcohol. SIGNS AND SYMPTOMS Hypertension does not usually cause signs or symptoms. Extremely high blood pressure (hypertensive crisis) may cause headache, anxiety, shortness of breath, and nosebleed. DIAGNOSIS To check if you have hypertension, your health care provider will measure your blood pressure while you are seated, with your arm held at the level of your heart. It should be measured at least twice using the same arm. Certain conditions can cause a difference in blood pressure between your right and left arms. A blood pressure reading that is higher than normal on one occasion does not mean that you need treatment. If  it is not clear whether you have high blood pressure, you may be asked to return on a different day to have your blood pressure checked again. Or, you may be asked to monitor your blood pressure at home for 1 or more weeks. TREATMENT Treating high blood pressure includes making lifestyle changes and possibly taking medicine. Living a healthy lifestyle can help lower high blood pressure. You may need to change some of your habits. Lifestyle changes may include:  Following the DASH diet. This diet is high in fruits, vegetables, and whole grains. It is low in salt, red meat, and added sugars.  Keep your sodium intake below 2,300 mg per day.  Getting at least 30-45 minutes of aerobic exercise at least 4 times per week.  Losing weight if necessary.  Not smoking.  Limiting alcoholic beverages.  Learning ways to reduce stress. Your health care provider may prescribe medicine if lifestyle changes are not enough to get your blood pressure under control, and if one of the following is true:  You are 18-59 years of age and your systolic blood pressure is above 140.  You are 60 years of age or older, and your systolic blood pressure is above 150.  Your diastolic blood pressure is above 90.  You have diabetes, and your systolic blood pressure is over 140 or your diastolic blood pressure is over 90.  You have kidney disease and your blood pressure is above 140/90.  You have heart disease and your blood pressure is above 140/90. Your personal target blood pressure may vary depending on your medical conditions, your age, and other factors. HOME CARE INSTRUCTIONS    Have your blood pressure rechecked as directed by your health care provider.   Take medicines only as directed by your health care provider. Follow the directions carefully. Blood pressure medicines must be taken as prescribed. The medicine does not work as well when you skip doses. Skipping doses also puts you at risk for  problems.  Do not smoke.   Monitor your blood pressure at home as directed by your health care provider. SEEK MEDICAL CARE IF:   You think you are having a reaction to medicines taken.  You have recurrent headaches or feel dizzy.  You have swelling in your ankles.  You have trouble with your vision. SEEK IMMEDIATE MEDICAL CARE IF:  You develop a severe headache or confusion.  You have unusual weakness, numbness, or feel faint.  You have severe chest or abdominal pain.  You vomit repeatedly.  You have trouble breathing. MAKE SURE YOU:   Understand these instructions.  Will watch your condition.  Will get help right away if you are not doing well or get worse.   This information is not intended to replace advice given to you by your health care provider. Make sure you discuss any questions you have with your health care provider.   Document Released: 06/18/2005 Document Revised: 11/02/2014 Document Reviewed: 04/10/2013 [employer] Interactive Patient Education 2016 [employer]  

## 2015-07-29 NOTE — Progress Notes (Signed)
Patient ID: Christine Walter, female    DOB: 1976/04/28  Age: 40 y.o. MRN: [id number]    Subjective:  Subjective HPI Christine Walter presents for bp f/u.  No complaints..  She ran out of her meds about 1 week ago.    Review of Systems  Constitutional: Negative for diaphoresis, appetite change, fatigue and unexpected weight change.  Eyes: Negative for pain, redness and visual disturbance.  Respiratory: Negative for cough, chest tightness, shortness of breath and wheezing.   Cardiovascular: Negative for chest pain, palpitations and leg swelling.  Endocrine: Negative for cold intolerance, heat intolerance, polydipsia, polyphagia and polyuria.  Genitourinary: Negative for dysuria, frequency and difficulty urinating.  Neurological: Negative for dizziness, light-headedness, numbness and headaches.    History Past Medical History  Diagnosis Date  . Hypertension     She has no past surgical history on file.   Her family history is not on file.She reports that she has never smoked. She has never used smokeless tobacco. She reports that she does not drink alcohol or use illicit drugs.  No current outpatient prescriptions on file prior to visit.   No current facility-administered medications on file prior to visit.     Objective:  Objective Physical Exam  Constitutional: She is oriented to person, place, and time. She appears well-developed and well-nourished.  HENT:  Head: Normocephalic and atraumatic.  Eyes: Conjunctivae and EOM are normal.  Neck: Normal range of motion. Neck supple. No JVD present. Carotid bruit is not present. No thyromegaly present.  Cardiovascular: Normal rate, regular rhythm and normal heart sounds.   No murmur heard. Pulmonary/Chest: Effort normal and breath sounds normal. No respiratory distress. She has no wheezes. She has no rales. She exhibits no tenderness.  Musculoskeletal: She exhibits no edema.  Neurological: She is alert and oriented to person,  place, and time.  Psychiatric: She has a normal mood and affect.   BP 140/110 mmHg  Pulse 66  Temp(Src) 98.9 F (37.2 C) (Oral)  Wt 234 lb 9.6 oz (106.414 kg)  SpO2 98% Wt Readings from Last 3 Encounters:  07/29/15 234 lb 9.6 oz (106.414 kg)  08/03/14 212 lb 6 oz (96.333 kg)  11/12/12 223 lb (101.152 kg)     Lab Results  Component Value Date   WBC 5.8 11/12/2012   HGB 12.2 11/12/2012   HCT 36.7 11/12/2012   PLT 215.0 11/12/2012   GLUCOSE 86 07/29/2015   CHOL 196 07/29/2015   TRIG 70.0 07/29/2015   HDL 44.80 07/29/2015   LDLCALC 137* 07/29/2015   ALT 12 07/29/2015   AST 14 07/29/2015   NA 140 07/29/2015   K 4.2 07/29/2015   CL 105 07/29/2015   CREATININE 1.08 07/29/2015   BUN 13 07/29/2015   CO2 30 07/29/2015   TSH 0.71 11/12/2012   MICROALBUR 0.8 11/12/2012    Dg Chest 2 View  03/21/2011  *RADIOLOGY REPORT* Clinical Data: 4-week history of cough, evaluate for pneumonia CHEST - 2 VIEW Comparison: None Findings: Normal cardiac silhouette and mediastinal contours. Evaluation of the retrosternal clear space is limited secondary to overlying soft tissues.  No focal parenchymal opacities.  No pleural effusion or pneumothorax.  Post cholecystectomy.  No acute osseous abnormalities. IMPRESSION: No acute cardiopulmonary disease.  Specifically, no pneumonia. Original Report Authenticated By: Rachel Moulds, M.D.    Assessment & Plan:  Plan I have discontinued Ms. Balli's fluticasone, guaiFENesin-codeine, and loratadine. I have also changed her losartan-hydrochlorothiazide.  Meds ordered this encounter  Medications  .  losartan-hydrochlorothiazide (HYZAAR) 100-12.5 MG tablet    Sig: Take 1 tablet by mouth daily.    Dispense:  30 tablet    Refill:  5    Problem List Items Addressed This Visit      Unprioritized   Essential hypertension - Primary    Pt was off meds for 1 week Restart hyzaar rto 2 -3 weeks or sooner prn      Relevant Medications    losartan-hydrochlorothiazide (HYZAAR) 100-12.5 MG tablet   Other Relevant Orders   Lipid panel (Completed)   Comp Met (CMET) (Completed)    Other Visit Diagnoses    Encounter for screening for HIV        Relevant Orders    HIV antibody (Completed)       Follow-up: Return in about 2 weeks (around 08/12/2015) for hypertension.  Christine Koyanagi, DO

## 2015-07-30 ENCOUNTER — Encounter: Payer: Self-pay | Admitting: Family Medicine

## 2015-07-30 LAB — HIV ANTIBODY (ROUTINE TESTING W REFLEX): HIV: NONREACTIVE

## 2015-10-25 ENCOUNTER — Encounter (HOSPITAL_COMMUNITY): Payer: Self-pay | Admitting: Emergency Medicine

## 2015-10-25 ENCOUNTER — Emergency Department (HOSPITAL_COMMUNITY): Payer: BC Managed Care – PPO

## 2015-10-25 ENCOUNTER — Emergency Department (HOSPITAL_COMMUNITY)
Admission: EM | Admit: 2015-10-25 | Discharge: 2015-10-25 | Disposition: A | Discharge status: Home / Self Care | Payer: BC Managed Care – PPO | Attending: Emergency Medicine | Admitting: Emergency Medicine

## 2015-10-25 DIAGNOSIS — Y9389 Activity, other specified: Secondary | ICD-10-CM | POA: Diagnosis not present

## 2015-10-25 DIAGNOSIS — Z79899 Other long term (current) drug therapy: Secondary | ICD-10-CM | POA: Insufficient documentation

## 2015-10-25 DIAGNOSIS — Y998 Other external cause status: Secondary | ICD-10-CM | POA: Diagnosis not present

## 2015-10-25 DIAGNOSIS — I10 Essential (primary) hypertension: Secondary | ICD-10-CM | POA: Insufficient documentation

## 2015-10-25 DIAGNOSIS — S29002A Unspecified injury of muscle and tendon of back wall of thorax, initial encounter: Secondary | ICD-10-CM | POA: Diagnosis not present

## 2015-10-25 DIAGNOSIS — M549 Dorsalgia, unspecified: Secondary | ICD-10-CM

## 2015-10-25 DIAGNOSIS — S4992XA Unspecified injury of left shoulder and upper arm, initial encounter: Secondary | ICD-10-CM | POA: Diagnosis not present

## 2015-10-25 DIAGNOSIS — S4991XA Unspecified injury of right shoulder and upper arm, initial encounter: Secondary | ICD-10-CM | POA: Insufficient documentation

## 2015-10-25 DIAGNOSIS — Y92410 Unspecified street and highway as the place of occurrence of the external cause: Secondary | ICD-10-CM | POA: Diagnosis not present

## 2015-10-25 DIAGNOSIS — S8991XA Unspecified injury of right lower leg, initial encounter: Secondary | ICD-10-CM | POA: Insufficient documentation

## 2015-10-25 DIAGNOSIS — S29001A Unspecified injury of muscle and tendon of front wall of thorax, initial encounter: Secondary | ICD-10-CM | POA: Diagnosis present

## 2015-10-25 DIAGNOSIS — M25561 Pain in right knee: Secondary | ICD-10-CM

## 2015-10-25 DIAGNOSIS — R0789 Other chest pain: Secondary | ICD-10-CM

## 2015-10-25 MED ORDER — IBUPROFEN 800 MG PO TABS: 800.0000 mg | ORAL_TABLET | Freq: Three times a day (TID) | ORAL | Status: DC

## 2015-10-25 MED ORDER — METHOCARBAMOL 500 MG PO TABS: 500.0000 mg | ORAL_TABLET | Freq: Two times a day (BID) | ORAL | Status: DC

## 2015-10-25 MED ORDER — METHOCARBAMOL 500 MG PO TABS
500.0000 mg | ORAL_TABLET | Freq: Once | ORAL | Status: AC
  Administered 2015-10-25: 500 mg via ORAL
  Filled 2015-10-25: qty 1

## 2015-10-25 MED ORDER — IBUPROFEN 400 MG PO TABS
800.0000 mg | ORAL_TABLET | Freq: Once | ORAL | Status: AC
  Administered 2015-10-25: 800 mg via ORAL
  Filled 2015-10-25: qty 2
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
## 2016-02-24 ENCOUNTER — Other Ambulatory Visit: Payer: Self-pay | Admitting: Family Medicine

## 2016-02-24 DIAGNOSIS — I10 Essential (primary) hypertension: Secondary | ICD-10-CM

## 2016-02-27 NOTE — Telephone Encounter (Signed)
This patient needs an apt, she was supposed to followed up in 1 week and never came back. Please scheduled Hypertension follow up. Thanks    KP

## 2016-02-28 NOTE — Telephone Encounter (Signed)
Phone number is not a working number.

## 2016-03-15 NOTE — Telephone Encounter (Signed)
Sent patient mychart message

## 2016-04-04 ENCOUNTER — Other Ambulatory Visit: Payer: Self-pay | Admitting: Family Medicine

## 2016-04-04 DIAGNOSIS — I10 Essential (primary) hypertension: Secondary | ICD-10-CM

## 2016-04-23 ENCOUNTER — Other Ambulatory Visit: Payer: Self-pay | Admitting: Family Medicine

## 2016-04-23 DIAGNOSIS — I10 Essential (primary) hypertension: Secondary | ICD-10-CM

## 2016-04-26 ENCOUNTER — Telehealth: Payer: Self-pay | Admitting: Family Medicine

## 2016-04-26 DIAGNOSIS — I10 Essential (primary) hypertension: Secondary | ICD-10-CM

## 2016-04-26 NOTE — Telephone Encounter (Signed)
Ok to call in 1 month

## 2016-04-26 NOTE — Telephone Encounter (Signed)
°  Relation to OZ:DGUYpt:self Call back number:[phone] [facility] [address] - [place], [place] - [id number]  Reason for call:  Patient requesting losartan-hydrochlorothiazide (HYZAAR) 100-12.5 MG tablet refill to hold her over until 05/03/16 appointment. Please advise

## 2016-04-26 NOTE — Telephone Encounter (Signed)
Last ov 07/29/15. Last fill 04/23/16 15 0 rf. Labs 07/29/15. Patient is over due for an office visit. Please advise.

## 2016-04-27 MED ORDER — LOSARTAN POTASSIUM-HCTZ 100-12.5 MG PO TABS: 1.0000 | ORAL_TABLET | Freq: Every day | ORAL | 0 refills | Status: DC

## 2016-05-03 ENCOUNTER — Encounter: Payer: Self-pay | Admitting: Family Medicine

## 2016-05-03 ENCOUNTER — Ambulatory Visit (INDEPENDENT_AMBULATORY_CARE_PROVIDER_SITE_OTHER): Payer: BC Managed Care – PPO | Admitting: Family Medicine

## 2016-05-03 VITALS — BP 110/82 | HR 90 | Temp 98.3°F | Resp 16 | Ht 65.0 in | Wt 237.2 lb

## 2016-05-03 DIAGNOSIS — I10 Essential (primary) hypertension: Secondary | ICD-10-CM

## 2016-05-03 LAB — LIPID PANEL
CHOL/HDL RATIO: 4
Cholesterol: 193 mg/dL (ref 0–200)
HDL: 49 mg/dL (ref >39.00)
LDL Cholesterol: 130 mg/dL — ABNORMAL HIGH (ref 0–99)
NONHDL: 144.43
Triglycerides: 70 mg/dL (ref 0.0–149.0)
VLDL: 14 mg/dL (ref 0.0–40.0)

## 2016-05-03 LAB — COMPREHENSIVE METABOLIC PANEL
ALT: 9 U/L (ref 0–35)
AST: 12 U/L (ref 0–37)
Albumin: 4.1 g/dL (ref 3.5–5.2)
Alkaline Phosphatase: 95 U/L (ref 39–117)
BILIRUBIN TOTAL: 0.6 mg/dL (ref 0.2–1.2)
BUN: 14 mg/dL (ref 6–23)
CHLORIDE: 103 meq/L (ref 96–112)
CO2: 28 meq/L (ref 19–32)
CREATININE: 0.98 mg/dL (ref 0.40–1.20)
Calcium: 9.7 mg/dL (ref 8.4–10.5)
GFR: 81.65 mL/min (ref >60.00)
GLUCOSE: 100 mg/dL — AB (ref 70–99)
Potassium: 3.9 mEq/L (ref 3.5–5.1)
Sodium: 138 mEq/L (ref 135–145)
Total Protein: 7.5 g/dL (ref 6.0–8.3)

## 2016-05-03 MED ORDER — LOSARTAN POTASSIUM-HCTZ 100-12.5 MG PO TABS: 1.0000 | ORAL_TABLET | Freq: Every day | ORAL | 1 refills | Status: DC
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
## 2016-05-03 NOTE — Progress Notes (Signed)
Pre visit review using our clinic review tool, if applicable. No additional management support is needed unless otherwise documented below in the visit note. 

## 2016-05-03 NOTE — Patient Instructions (Signed)
Hypertension Hypertension, commonly called high blood pressure, is when the force of blood pumping through your arteries is too strong. Your arteries are the blood vessels that carry blood from your heart throughout your body. A blood pressure reading consists of a higher number over a lower number, such as 110/72. The higher number (systolic) is the pressure inside your arteries when your heart pumps. The lower number (diastolic) is the pressure inside your arteries when your heart relaxes. Ideally you want your blood pressure below 120/80. Hypertension forces your heart to work harder to pump blood. Your arteries may become narrow or stiff. Having untreated or uncontrolled hypertension can cause heart attack, stroke, kidney disease, and other problems. RISK FACTORS Some risk factors for high blood pressure are controllable. Others are not.  Risk factors you cannot control include:   Race. You may be at higher risk if you are African American.  Age. Risk increases with age.  Gender. Men are at higher risk than women before age 45 years. After age 65, women are at higher risk than men. Risk factors you can control include:  Not getting enough exercise or physical activity.  Being overweight.  Getting too much fat, sugar, calories, or salt in your diet.  Drinking too much alcohol. SIGNS AND SYMPTOMS Hypertension does not usually cause signs or symptoms. Extremely high blood pressure (hypertensive crisis) may cause headache, anxiety, shortness of breath, and nosebleed. DIAGNOSIS To check if you have hypertension, your health care provider will measure your blood pressure while you are seated, with your arm held at the level of your heart. It should be measured at least twice using the same arm. Certain conditions can cause a difference in blood pressure between your right and left arms. A blood pressure reading that is higher than normal on one occasion does not mean that you need treatment. If  it is not clear whether you have high blood pressure, you may be asked to return on a different day to have your blood pressure checked again. Or, you may be asked to monitor your blood pressure at home for 1 or more weeks. TREATMENT Treating high blood pressure includes making lifestyle changes and possibly taking medicine. Living a healthy lifestyle can help lower high blood pressure. You may need to change some of your habits. Lifestyle changes may include:  Following the DASH diet. This diet is high in fruits, vegetables, and whole grains. It is low in salt, red meat, and added sugars.  Keep your sodium intake below 2,300 mg per day.  Getting at least 30-45 minutes of aerobic exercise at least 4 times per week.  Losing weight if necessary.  Not smoking.  Limiting alcoholic beverages.  Learning ways to reduce stress. Your health care provider may prescribe medicine if lifestyle changes are not enough to get your blood pressure under control, and if one of the following is true:  You are 18-59 years of age and your systolic blood pressure is above 140.  You are 60 years of age or older, and your systolic blood pressure is above 150.  Your diastolic blood pressure is above 90.  You have diabetes, and your systolic blood pressure is over 140 or your diastolic blood pressure is over 90.  You have kidney disease and your blood pressure is above 140/90.  You have heart disease and your blood pressure is above 140/90. Your personal target blood pressure may vary depending on your medical conditions, your age, and other factors. HOME CARE INSTRUCTIONS    Have your blood pressure rechecked as directed by your health care provider.   Take medicines only as directed by your health care provider. Follow the directions carefully. Blood pressure medicines must be taken as prescribed. The medicine does not work as well when you skip doses. Skipping doses also puts you at risk for  problems.  Do not smoke.   Monitor your blood pressure at home as directed by your health care provider. SEEK MEDICAL CARE IF:   You think you are having a reaction to medicines taken.  You have recurrent headaches or feel dizzy.  You have swelling in your ankles.  You have trouble with your vision. SEEK IMMEDIATE MEDICAL CARE IF:  You develop a severe headache or confusion.  You have unusual weakness, numbness, or feel faint.  You have severe chest or abdominal pain.  You vomit repeatedly.  You have trouble breathing. MAKE SURE YOU:   Understand these instructions.  Will watch your condition.  Will get help right away if you are not doing well or get worse.   This information is not intended to replace advice given to you by your health care provider. Make sure you discuss any questions you have with your health care provider.   Document Released: 06/18/2005 Document Revised: 11/02/2014 Document Reviewed: 04/10/2013 [employer] Interactive Patient Education 2016 [employer]  

## 2016-05-03 NOTE — Assessment & Plan Note (Signed)
con't losartan stable 

## 2016-05-04 ENCOUNTER — Other Ambulatory Visit: Payer: Self-pay | Admitting: Emergency Medicine

## 2016-05-04 DIAGNOSIS — I10 Essential (primary) hypertension: Secondary | ICD-10-CM

## 2016-09-03 ENCOUNTER — Other Ambulatory Visit: Payer: BC Managed Care – PPO

## 2016-11-02 ENCOUNTER — Encounter: Payer: BC Managed Care – PPO | Admitting: Family Medicine

## 2016-12-21 ENCOUNTER — Encounter: Payer: BC Managed Care – PPO | Admitting: Family Medicine

## 2016-12-28 ENCOUNTER — Other Ambulatory Visit: Payer: Self-pay | Admitting: Family Medicine

## 2016-12-28 DIAGNOSIS — I10 Essential (primary) hypertension: Secondary | ICD-10-CM

## 2017-04-12 ENCOUNTER — Telehealth: Payer: Self-pay | Admitting: Family Medicine

## 2017-04-12 DIAGNOSIS — I10 Essential (primary) hypertension: Secondary | ICD-10-CM

## 2017-04-12 NOTE — Telephone Encounter (Signed)
Was advised by PCP to F/U in May/asked pharm to advise pt to sched appt first/thx dmf

## 2017-04-16 NOTE — Telephone Encounter (Signed)
Patient is scheduled for 04/18/17, patient is completely out of meds, can enough be called in to get her through to her appt?

## 2017-04-17 ENCOUNTER — Other Ambulatory Visit: Payer: Self-pay | Admitting: Emergency Medicine

## 2017-04-17 DIAGNOSIS — I10 Essential (primary) hypertension: Secondary | ICD-10-CM

## 2017-04-17 MED ORDER — LOSARTAN POTASSIUM-HCTZ 100-12.5 MG PO TABS: 1.0000 | ORAL_TABLET | Freq: Every day | ORAL | 0 refills | Status: DC

## 2017-04-17 NOTE — Telephone Encounter (Signed)
LOSARTAN out since Sunday. Pt states feet have some swelling & pt has constant headache. Please call in a short supply so she can take dose today and feel better. [facility] [address]. Pt ph [phone]. Pt has appt with Lowne tomorrow 04/18/17 but pt would like some relief today with a dose for today if possible. Sent to [facility] since Laury AxonLowne is off today.

## 2017-04-17 NOTE — Telephone Encounter (Signed)
Refill sent per pt request.  

## 2017-04-18 ENCOUNTER — Encounter: Payer: Self-pay | Admitting: Family Medicine

## 2017-04-18 ENCOUNTER — Other Ambulatory Visit: Payer: Self-pay

## 2017-04-18 ENCOUNTER — Ambulatory Visit (INDEPENDENT_AMBULATORY_CARE_PROVIDER_SITE_OTHER): Payer: BC Managed Care – PPO | Admitting: Family Medicine

## 2017-04-18 VITALS — BP 140/98 | HR 78 | Temp 98.7°F | Ht 65.0 in | Wt 246.0 lb

## 2017-04-18 DIAGNOSIS — I10 Essential (primary) hypertension: Secondary | ICD-10-CM | POA: Diagnosis not present

## 2017-04-18 LAB — CBC WITH DIFFERENTIAL/PLATELET
BASOS PCT: 0.2 % (ref 0.0–3.0)
Basophils Absolute: 0 10*3/uL (ref 0.0–0.1)
EOS ABS: 0 10*3/uL (ref 0.0–0.7)
EOS PCT: 0.7 % (ref 0.0–5.0)
HCT: 38.5 % (ref 36.0–46.0)
HEMOGLOBIN: 12.2 g/dL (ref 12.0–15.0)
LYMPHS ABS: 1.8 10*3/uL (ref 0.7–4.0)
Lymphocytes Relative: 27.7 % (ref 12.0–46.0)
MCHC: 31.6 g/dL (ref 30.0–36.0)
MCV: 87.7 fl (ref 78.0–100.0)
MONO ABS: 0.7 10*3/uL (ref 0.1–1.0)
Monocytes Relative: 10.9 % (ref 3.0–12.0)
NEUTROS PCT: 60.5 % (ref 43.0–77.0)
Neutro Abs: 4 10*3/uL (ref 1.4–7.7)
Platelets: 218 10*3/uL (ref 150.0–400.0)
RBC: 4.39 Mil/uL (ref 3.87–5.11)
RDW: 13.6 % (ref 11.5–15.5)
WBC: 6.7 10*3/uL (ref 4.0–10.5)

## 2017-04-18 LAB — LIPID PANEL
Cholesterol: 191 mg/dL (ref 0–200)
HDL: 44 mg/dL (ref >39.00)
LDL Cholesterol: 131 mg/dL — ABNORMAL HIGH (ref 0–99)
NONHDL: 146.85
Total CHOL/HDL Ratio: 4
Triglycerides: 77 mg/dL (ref 0.0–149.0)
VLDL: 15.4 mg/dL (ref 0.0–40.0)

## 2017-04-18 LAB — COMPREHENSIVE METABOLIC PANEL
ALK PHOS: 92 U/L (ref 39–117)
ALT: 13 U/L (ref 0–35)
AST: 15 U/L (ref 0–37)
Albumin: 4 g/dL (ref 3.5–5.2)
BUN: 8 mg/dL (ref 6–23)
CO2: 29 mEq/L (ref 19–32)
Calcium: 9.3 mg/dL (ref 8.4–10.5)
Chloride: 103 mEq/L (ref 96–112)
Creatinine, Ser: 0.94 mg/dL (ref 0.40–1.20)
GFR: 85.24 mL/min (ref >60.00)
Glucose, Bld: 85 mg/dL (ref 70–99)
POTASSIUM: 4 meq/L (ref 3.5–5.1)
SODIUM: 137 meq/L (ref 135–145)
TOTAL PROTEIN: 7.4 g/dL (ref 6.0–8.3)
Total Bilirubin: 0.6 mg/dL (ref 0.2–1.2)

## 2017-04-18 MED ORDER — LOSARTAN POTASSIUM-HCTZ 100-12.5 MG PO TABS: 1.0000 | ORAL_TABLET | Freq: Every day | ORAL | 2 refills | Status: DC

## 2017-04-18 NOTE — Patient Instructions (Signed)

## 2017-04-18 NOTE — Assessment & Plan Note (Signed)
Poorly controlled will alter medications, encouraged DASH diet, minimize caffeine and obtain adequate sleep. Report concerning symptoms and follow up as directed and as needed 
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
## 2017-04-30 ENCOUNTER — Encounter: Payer: Self-pay | Admitting: Family Medicine

## 2017-04-30 ENCOUNTER — Ambulatory Visit (INDEPENDENT_AMBULATORY_CARE_PROVIDER_SITE_OTHER): Payer: BC Managed Care – PPO | Admitting: Family Medicine

## 2017-04-30 VITALS — BP 122/86 | HR 68 | Ht 65.0 in | Wt 243.0 lb

## 2017-04-30 DIAGNOSIS — G43829 Menstrual migraine, not intractable, without status migrainosus: Secondary | ICD-10-CM | POA: Diagnosis not present

## 2017-04-30 MED ORDER — FROVATRIPTAN SUCCINATE 2.5 MG PO TABS: 2.5000 mg | ORAL_TABLET | ORAL | 0 refills | Status: DC | PRN

## 2017-04-30 MED ORDER — KETOROLAC TROMETHAMINE 60 MG/2ML IM SOLN
60.0000 mg | Freq: Once | INTRAMUSCULAR | Status: AC
  Administered 2017-04-30: 60 mg via INTRAMUSCULAR
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
## 2017-04-30 NOTE — Patient Instructions (Signed)

## 2017-05-16 ENCOUNTER — Encounter: Payer: Self-pay | Admitting: Family Medicine

## 2017-05-17 ENCOUNTER — Other Ambulatory Visit: Payer: Self-pay | Admitting: Family Medicine

## 2017-05-17 MED ORDER — OLMESARTAN MEDOXOMIL-HCTZ 20-12.5 MG PO TABS: 1.0000 | ORAL_TABLET | Freq: Every day | ORAL | 2 refills | Status: DC

## 2017-05-17 NOTE — Telephone Encounter (Signed)
Read Dr. Maryln GottronLowne-Chase's comments off of mychart and scheduled the nurse visit to have BP checked

## 2017-05-17 NOTE — Telephone Encounter (Signed)
Patient called back in regards to RX that requested and stts she is completely out and would like to have a answer or a new RX by 05/17/2017 @ 5:00pm.   Please call patient.

## 2017-05-17 NOTE — Telephone Encounter (Signed)
I answered this 

## 2017-05-17 NOTE — Telephone Encounter (Signed)
benicar 10 mg daily---- #30  2 refills

## 2017-05-17 NOTE — Telephone Encounter (Signed)
benicar 20 mg #30  1 po qd, 2 refills   bp check 2-3 weeks  

## 2017-05-17 NOTE — Telephone Encounter (Signed)
Won't let me order benicar 10mg . Also Pt is on losartan-hctz combo.

## 2017-05-17 NOTE — Telephone Encounter (Signed)
Relation to VH:QIONpt:self Call back number:[phone] [facility] [address] - [place], [place] - [address] [phone]) [phone] (Fax)     Reason for call:  Patient states she contacted pharmacy regarding her losartan-hydrochlorothiazide (HYZAAR) 100-12.5 MG tablet and pharmacy advised patient to call PCP to inform medication is recalled. Patient requesting alternate Rx, and states she's completely out and would like Rx sent today, please advise

## 2017-06-04 ENCOUNTER — Other Ambulatory Visit: Payer: Self-pay

## 2017-06-04 ENCOUNTER — Ambulatory Visit: Payer: BC Managed Care – PPO | Admitting: Family Medicine

## 2017-06-04 ENCOUNTER — Telehealth: Payer: Self-pay | Admitting: Family Medicine

## 2017-06-04 DIAGNOSIS — I10 Essential (primary) hypertension: Secondary | ICD-10-CM

## 2017-06-04 MED ORDER — OLMESARTAN MEDOXOMIL-HCTZ 40-25 MG PO TABS: 1.0000 | ORAL_TABLET | Freq: Every day | ORAL | 1 refills | Status: DC

## 2017-06-04 MED ORDER — OLMESARTAN MEDOXOMIL-HCTZ 20-12.5 MG PO TABS: 1.0000 | ORAL_TABLET | Freq: Every day | ORAL | 1 refills | Status: DC

## 2017-06-04 NOTE — Telephone Encounter (Signed)
Copied from CRM [id number]. Topic: Quick Communication - See Telephone Encounter >> Jun 04, 2017  4:27 PM Herby AbrahamJohnson, Shiquita C wrote: CRM for notification. See Telephone encounter for: Pt's pharmacy called in to be advised. They received a Rx for olmesartan quantity of 1, pharmacy would like to be advised, she said that usually pt's are prescribed more. She would like to be advised further. Pharmacy says that if provider is making a change to Rx if office would fax a new Rx to them and void out old one to avoid confusion.    Thanks   06/04/17.

## 2017-06-04 NOTE — Telephone Encounter (Signed)
Medication order corrected. Per Dr. Laury AxonLowne- Chases new orders for today. Re-sent to pharmacy. (Medication increased.)

## 2017-06-04 NOTE — Progress Notes (Signed)
Pre visit review using our clinic tool,if applicable. No additional management support is needed unless otherwise documented below in the visit note.   Patient in for BP check per order from Dr. Zola ButtonLowne-Chase dated 05/17/17.  Patient Stopped Losartan-HCTZ and started Benicar-Hctz 20-12.5 mg.  No complaints voiced this visit.  BP today = 126/89 P= 92  Per Dr. Zola ButtonLowne-Chase patient to increase Benicar -Hctz to 40 -25 mg daily and return for OV in 3 months.   Appointment scheduled for 09/02/2017.

## 2017-08-14 ENCOUNTER — Other Ambulatory Visit: Payer: Self-pay | Admitting: Family Medicine

## 2017-08-14 NOTE — Telephone Encounter (Signed)
Called pt to reschedule her appt on 09/02/17 and she wanted to know the status of her prescription refill. I told her I would put a note in her chart for someone to follow up with her.

## 2017-08-14 NOTE — Telephone Encounter (Signed)
Called pt to let her know her prescription was refilled and sent to pharmacy on file.
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
## 2017-08-22 ENCOUNTER — Telehealth: Payer: Self-pay | Admitting: Family Medicine

## 2017-08-22 NOTE — Telephone Encounter (Signed)
Copied from CRM [id number]. Topic: Quick Communication - Rx Refill/Question >> Aug 22, 2017  1:34 PM Crist InfanteHarrald, Kathy J wrote: Medication: olmesartan-hydrochlorothiazide (BENICAR HCT) 40-25 MG tablet  Pharmacy advised pt this med is on backorder. They do not know when they will get in. They asked the pt to call her doctor and have another ned [facility] [address] [place], [place] - [id number] [phone] (Phone) [phone] (Fax)

## 2017-08-23 MED ORDER — AMLODIPINE BESYLATE 5 MG PO TABS: 5.0000 mg | ORAL_TABLET | Freq: Every day | ORAL | 2 refills | Status: DC

## 2017-08-23 NOTE — Telephone Encounter (Signed)
New rx sent in

## 2017-08-23 NOTE — Telephone Encounter (Signed)
D/c benicar norvasc 5 mg #30  2 refills 1 po qd

## 2017-09-02 ENCOUNTER — Ambulatory Visit: Payer: BC Managed Care – PPO | Admitting: Family Medicine

## 2017-09-03 ENCOUNTER — Encounter: Payer: Self-pay | Admitting: Family Medicine

## 2017-09-03 ENCOUNTER — Ambulatory Visit: Payer: BC Managed Care – PPO | Admitting: Family Medicine

## 2017-09-03 VITALS — BP 135/90 | HR 79 | Temp 98.2°F | Resp 16 | Ht 65.0 in | Wt 244.6 lb

## 2017-09-03 DIAGNOSIS — I10 Essential (primary) hypertension: Secondary | ICD-10-CM

## 2017-09-03 MED ORDER — HYDROCHLOROTHIAZIDE 25 MG PO TABS: 25.0000 mg | ORAL_TABLET | Freq: Every day | ORAL | 3 refills | Status: DC

## 2017-09-03 NOTE — Assessment & Plan Note (Signed)
Poorly controlled will alter medications, encouraged DASH diet, minimize caffeine and obtain adequate sleep. Report concerning symptoms and follow up as directed and as needed 

## 2017-09-03 NOTE — Progress Notes (Signed)
Subjective:  I acted as a Neurosurgeonscribe for Christine Walter. Christine SongShaneka, RMA   Patient ID: Christine Walter, female    DOB: 10/18/1975, [age] y.o.   MRN: [id number]  Chief Complaint  Patient presents with  . Follow-up    HPI  Patient is in today for hypertension follow up.  No complaints.  Patient Care Team: Christine ButtonLowne Chase, Christine CongressYvonne R, DO as PCP - General   Past Medical History:  Diagnosis Date  . Hypertension     Past Surgical History:  Procedure Laterality Date  . CHOLECYSTECTOMY    . kidney stent      History reviewed. No pertinent family history.  Social History   Socioeconomic History  . Marital status: Married    Spouse name: Not on file  . Number of children: Not on file  . Years of education: Not on file  . Highest education level: Not on file  Social Needs  . Financial resource strain: Not on file  . Food insecurity - worry: Not on file  . Food insecurity - inability: Not on file  . Transportation needs - medical: Not on file  . Transportation needs - non-medical: Not on file  Occupational History    Employer: [employer]    Comment: admin assis to [employer]  Tobacco Use  . Smoking status: Never Smoker  . Smokeless tobacco: Never Used  Substance and Sexual Activity  . Alcohol use: No    Alcohol/week: 0.0 oz    Comment: rare  . Drug use: No  . Sexual activity: Yes    Partners: Male  Other Topics Concern  . Not on file  Social History Narrative   Exercise --- no-- but a walking challenge has started at work    Outpatient Medications Prior to Visit  Medication Sig Dispense Refill  . amLODipine (NORVASC) 5 MG tablet Take 1 tablet (5 mg total) by mouth daily. 30 tablet 2  . frovatriptan (FROVA) 2.5 MG tablet Take 1 tablet (2.5 mg total) by mouth as needed for migraine. If recurs, may repeat after 2 hours. Max of 3 tabs in 24 hours. 10 tablet 0   No facility-administered medications prior to visit.     No Known Allergies  Review of Systems  Constitutional:  Negative for chills, fever and malaise/fatigue.  HENT: Negative for congestion and hearing loss.   Eyes: Negative for discharge.  Respiratory: Negative for cough, sputum production and shortness of breath.   Cardiovascular: Negative for chest pain, palpitations and leg swelling.  Gastrointestinal: Negative for abdominal pain, blood in stool, constipation, diarrhea, heartburn, nausea and vomiting.  Genitourinary: Negative for dysuria, frequency, hematuria and urgency.  Musculoskeletal: Negative for back pain, falls and myalgias.  Skin: Negative for rash.  Neurological: Negative for dizziness, sensory change, loss of consciousness, weakness and headaches.  Endo/Heme/Allergies: Negative for environmental allergies. Does not bruise/bleed easily.  Psychiatric/Behavioral: Negative for depression and suicidal ideas. The patient is not nervous/anxious and does not have insomnia.        Objective:    Physical Exam  Constitutional: She is oriented to person, place, and time. She appears well-developed and well-nourished.  HENT:  Head: Normocephalic and atraumatic.  Eyes: Conjunctivae and EOM are normal.  Neck: Normal range of motion. Neck supple. No JVD present. Carotid bruit is not present. No thyromegaly present.  Cardiovascular: Normal rate, regular rhythm and normal heart sounds.  No murmur heard. Pulmonary/Chest: Effort normal and breath sounds normal. No respiratory distress. She has no wheezes.  She has no rales. She exhibits no tenderness.  Musculoskeletal: She exhibits no edema.  Neurological: She is alert and oriented to person, place, and time.  Psychiatric: She has a normal mood and affect.  Nursing note and vitals reviewed.   BP 135/90 (BP Location: Left Arm, Patient Position: Sitting, Cuff Size: Large)   Pulse 79   Temp 98.2 F (36.8 C) (Oral)   Resp 16   Ht 5\' 5"  (1.651 m)   Wt 244 lb 9.6 oz (110.9 kg)   SpO2 100%   BMI 40.70 kg/m  Wt Readings from Last 3 Encounters:    09/03/17 244 lb 9.6 oz (110.9 kg)  04/30/17 243 lb (110.2 kg)  04/18/17 246 lb (111.6 kg)   BP Readings from Last 3 Encounters:  09/03/17 135/90  04/30/17 122/86  04/18/17 (!) 140/98     Immunization History  Administered Date(s) Administered  . Tdap 08/03/2011    Health Maintenance  Topic Date Due  . PAP SMEAR  12/30/2016  . INFLUENZA VACCINE  05/06/2018 (Originally 01/30/2017)  . TETANUS/TDAP  08/02/2021  . HIV Screening  Completed    Lab Results  Component Value Date   WBC 6.7 04/18/2017   HGB 12.2 04/18/2017   HCT 38.5 04/18/2017   PLT 218.0 04/18/2017   GLUCOSE 85 04/18/2017   CHOL 191 04/18/2017   TRIG 77.0 04/18/2017   HDL 44.00 04/18/2017   LDLCALC 131 (H) 04/18/2017   ALT 13 04/18/2017   AST 15 04/18/2017   NA 137 04/18/2017   K 4.0 04/18/2017   CL 103 04/18/2017   CREATININE 0.94 04/18/2017   BUN 8 04/18/2017   CO2 29 04/18/2017   TSH 0.71 11/12/2012   MICROALBUR 0.8 11/12/2012    Lab Results  Component Value Date   TSH 0.71 11/12/2012   Lab Results  Component Value Date   WBC 6.7 04/18/2017   HGB 12.2 04/18/2017   HCT 38.5 04/18/2017   MCV 87.7 04/18/2017   PLT 218.0 04/18/2017   Lab Results  Component Value Date   NA 137 04/18/2017   K 4.0 04/18/2017   CO2 29 04/18/2017   GLUCOSE 85 04/18/2017   BUN 8 04/18/2017   CREATININE 0.94 04/18/2017   BILITOT 0.6 04/18/2017   ALKPHOS 92 04/18/2017   AST 15 04/18/2017   ALT 13 04/18/2017   PROT 7.4 04/18/2017   ALBUMIN 4.0 04/18/2017   CALCIUM 9.3 04/18/2017   GFR 85.24 04/18/2017   Lab Results  Component Value Date   CHOL 191 04/18/2017   Lab Results  Component Value Date   HDL 44.00 04/18/2017   Lab Results  Component Value Date   LDLCALC 131 (H) 04/18/2017   Lab Results  Component Value Date   TRIG 77.0 04/18/2017   Lab Results  Component Value Date   CHOLHDL 4 04/18/2017   No results found for: HGBA1C       Assessment & Plan:   Problem List Items Addressed This  Visit      Unprioritized   Essential hypertension - Primary    Poorly controlled will alter medications, encouraged DASH diet, minimize caffeine and obtain adequate sleep. Report concerning symptoms and follow up as directed and as needed      Relevant Medications   hydrochlorothiazide (HYDRODIURIL) 25 MG tablet    con't norvasc , add hctz  I am having Christine Walter start on hydrochlorothiazide. I am also having her maintain her frovatriptan and amLODipine.  Meds ordered this encounter  Medications  . hydrochlorothiazide (HYDRODIURIL) 25 MG tablet    Sig: Take 1 tablet (25 mg total) by mouth daily.    Dispense:  90 tablet    Refill:  3    CMA served as scribe during this visit. History, Physical and Plan performed by medical provider. Documentation and orders reviewed and attested to.  [name], DO

## 2017-09-03 NOTE — Patient Instructions (Signed)

## 2017-09-04 LAB — CBC WITH DIFFERENTIAL/PLATELET
BASOS ABS: 0.1 10*3/uL (ref 0.0–0.1)
BASOS PCT: 0.7 % (ref 0.0–3.0)
EOS ABS: 0.1 10*3/uL (ref 0.0–0.7)
Eosinophils Relative: 0.9 % (ref 0.0–5.0)
HEMATOCRIT: 38 % (ref 36.0–46.0)
Hemoglobin: 12.3 g/dL (ref 12.0–15.0)
LYMPHS PCT: 23.5 % (ref 12.0–46.0)
Lymphs Abs: 1.8 10*3/uL (ref 0.7–4.0)
MCHC: 32.4 g/dL (ref 30.0–36.0)
MCV: 87.1 fl (ref 78.0–100.0)
MONOS PCT: 9 % (ref 3.0–12.0)
Monocytes Absolute: 0.7 10*3/uL (ref 0.1–1.0)
NEUTROS ABS: 5.1 10*3/uL (ref 1.4–7.7)
NEUTROS PCT: 65.9 % (ref 43.0–77.0)
PLATELETS: 207 10*3/uL (ref 150.0–400.0)
RBC: 4.37 Mil/uL (ref 3.87–5.11)
RDW: 14.5 % (ref 11.5–15.5)
WBC: 7.8 10*3/uL (ref 4.0–10.5)

## 2017-09-04 LAB — COMPREHENSIVE METABOLIC PANEL
ALT: 17 U/L (ref 0–35)
AST: 20 U/L (ref 0–37)
Albumin: 3.8 g/dL (ref 3.5–5.2)
Alkaline Phosphatase: 89 U/L (ref 39–117)
BUN: 7 mg/dL (ref 6–23)
CALCIUM: 9.1 mg/dL (ref 8.4–10.5)
CHLORIDE: 105 meq/L (ref 96–112)
CO2: 27 meq/L (ref 19–32)
Creatinine, Ser: 0.91 mg/dL (ref 0.40–1.20)
GFR: 88.32 mL/min (ref >60.00)
GLUCOSE: 89 mg/dL (ref 70–99)
Potassium: 4.3 mEq/L (ref 3.5–5.1)
Sodium: 139 mEq/L (ref 135–145)
Total Bilirubin: 0.4 mg/dL (ref 0.2–1.2)
Total Protein: 7.1 g/dL (ref 6.0–8.3)

## 2017-09-04 LAB — SEDIMENTATION RATE: SED RATE: 38 mm/h — AB (ref 0–20)

## 2017-09-04 LAB — TSH: TSH: 1.43 u[IU]/mL (ref 0.35–4.50)

## 2017-09-05 ENCOUNTER — Telehealth: Payer: Self-pay | Admitting: Family Medicine

## 2017-09-05 NOTE — Telephone Encounter (Signed)
Copied from CRM [id number]. Topic: Quick Communication - Lab Results >> Sep 05, 2017  4:19 PM Blima Ledgerichardson, Sheketia D, CMA wrote: Called patient to inform them of 04/30/17 lab results, they were done on 09/03/17. When patient returns call, triage nurse may disclose results. >> Sep 05, 2017  4:44 PM Raquel SarnaHayes, Teresa G wrote: Pt called to receive the lab results.  NP was busy. Please call pt to disclose results.

## 2017-09-06 NOTE — Telephone Encounter (Signed)
Pt calling this morning to get lab results. NT line was busy. Please call with results.

## 2017-09-06 NOTE — Telephone Encounter (Signed)
Charted in result notes. 

## 2017-09-17 ENCOUNTER — Ambulatory Visit: Payer: BC Managed Care – PPO

## 2017-09-27 ENCOUNTER — Ambulatory Visit (INDEPENDENT_AMBULATORY_CARE_PROVIDER_SITE_OTHER): Payer: BC Managed Care – PPO

## 2017-09-27 VITALS — BP 124/92 | HR 73 | Resp 18

## 2017-09-27 DIAGNOSIS — I10 Essential (primary) hypertension: Secondary | ICD-10-CM

## 2017-09-27 NOTE — Progress Notes (Addendum)
Patient in today for a blood pressure check. She is currently taking Taking hydrochlorothiazide (HYDRODIURIL) 25 MG tablet [[id number]][[id number]]  And amplodipine 5mg     No missed doses   Did not take this am  Today:  pulse 71 O2: 98  BP:122/90  Per Dr.Lowne, she wanted me to recheck to see if the diastolic number had came down some, to determine what to do. Rechecked bp was 124/92.  Patient was then advised to come back in 2 weeks, try to invest in a bp cuff for home, record her readings and bring them back with her at her next bp check in 2 weeks. And also take her medication before she comes in.   Patient scheduled for 10/11/17 @ 9:15am Nurse visit   Patient agreed, and voiced her understanding.

## 2017-09-27 NOTE — Progress Notes (Signed)
noted 

## 2017-09-27 NOTE — Patient Instructions (Signed)
Managing Your Hypertension Hypertension is commonly called high blood pressure. This is when the force of your blood pressing against the walls of your arteries is too strong. Arteries are blood vessels that carry blood from your heart throughout your body. Hypertension forces the heart to work harder to pump blood, and may cause the arteries to become narrow or stiff. Having untreated or uncontrolled hypertension can cause heart attack, stroke, kidney disease, and other problems. What are blood pressure readings? A blood pressure reading consists of a higher number over a lower number. Ideally, your blood pressure should be below 120/80. The first ("top") number is called the systolic pressure. It is a measure of the pressure in your arteries as your heart beats. The second ("bottom") number is called the diastolic pressure. It is a measure of the pressure in your arteries as the heart relaxes. What does my blood pressure reading mean? Blood pressure is classified into four stages. Based on your blood pressure reading, your health care provider may use the following stages to determine what type of treatment you need, if any. Systolic pressure and diastolic pressure are measured in a unit called mm Hg. Normal  Systolic pressure: below 120.  Diastolic pressure: below 80. Elevated  Systolic pressure: 120-129.  Diastolic pressure: below 80. Hypertension stage 1  Systolic pressure: 130-139.  Diastolic pressure: 80-89. Hypertension stage 2  Systolic pressure: 140 or above.  Diastolic pressure: 90 or above. What health risks are associated with hypertension? Managing your hypertension is an important responsibility. Uncontrolled hypertension can lead to:  A heart attack.  A stroke.  A weakened blood vessel (aneurysm).  Heart failure.  Kidney damage.  Eye damage.  Metabolic syndrome.  Memory and concentration problems.  What changes can I make to manage my  hypertension? Hypertension can be managed by making lifestyle changes and possibly by taking medicines. Your health care provider will help you make a plan to bring your blood pressure within a normal range. Eating and drinking  Eat a diet that is high in fiber and potassium, and low in salt (sodium), added sugar, and fat. An example eating plan is called the DASH (Dietary Approaches to Stop Hypertension) diet. To eat this way: ? Eat plenty of fresh fruits and vegetables. Try to fill half of your plate at each meal with fruits and vegetables. ? Eat whole grains, such as whole wheat pasta, brown rice, or whole grain bread. Fill about one quarter of your plate with whole grains. ? Eat low-fat diary products. ? Avoid fatty cuts of meat, processed or cured meats, and poultry with skin. Fill about one quarter of your plate with lean proteins such as fish, chicken without skin, beans, eggs, and tofu. ? Avoid premade and processed foods. These tend to be higher in sodium, added sugar, and fat.  Reduce your daily sodium intake. Most people with hypertension should eat less than 1,500 mg of sodium a day.  Limit alcohol intake to no more than 1 drink a day for nonpregnant women and 2 drinks a day for men. One drink equals 12 oz of beer, 5 oz of wine, or 1 oz of hard liquor. Lifestyle  Work with your health care provider to maintain a healthy body weight, or to lose weight. Ask what an ideal weight is for you.  Get at least 30 minutes of exercise that causes your heart to beat faster (aerobic exercise) most days of the week. Activities may include walking, swimming, or biking.  Include exercise   to strengthen your muscles (resistance exercise), such as weight lifting, as part of your weekly exercise routine. Try to do these types of exercises for 30 minutes at least 3 days a week.  Do not use any products that contain nicotine or tobacco, such as cigarettes and e-cigarettes. If you need help quitting, ask  your health care provider.  Control any long-term (chronic) conditions you have, such as high cholesterol or diabetes. Monitoring  Monitor your blood pressure at home as told by your health care provider. Your personal target blood pressure may vary depending on your medical conditions, your age, and other factors.  Have your blood pressure checked regularly, as often as told by your health care provider. Working with your health care provider  Review all the medicines you take with your health care provider because there may be side effects or interactions.  Talk with your health care provider about your diet, exercise habits, and other lifestyle factors that may be contributing to hypertension.  Visit your health care provider regularly. Your health care provider can help you create and adjust your plan for managing hypertension. Will I need medicine to control my blood pressure? Your health care provider may prescribe medicine if lifestyle changes are not enough to get your blood pressure under control, and if:  Your systolic blood pressure is 130 or higher.  Your diastolic blood pressure is 80 or higher.  Take medicines only as told by your health care provider. Follow the directions carefully. Blood pressure medicines must be taken as prescribed. The medicine does not work as well when you skip doses. Skipping doses also puts you at risk for problems. Contact a health care provider if:  You think you are having a reaction to medicines you have taken.  You have repeated (recurrent) headaches.  You feel dizzy.  You have swelling in your ankles.  You have trouble with your vision. Get help right away if:  You develop a severe headache or confusion.  You have unusual weakness or numbness, or you feel faint.  You have severe pain in your chest or abdomen.  You vomit repeatedly.  You have trouble breathing. Summary  Hypertension is when the force of blood pumping through  your arteries is too strong. If this condition is not controlled, it may put you at risk for serious complications.  Your personal target blood pressure may vary depending on your medical conditions, your age, and other factors. For most people, a normal blood pressure is less than 120/80.  Hypertension is managed by lifestyle changes, medicines, or both. Lifestyle changes include weight loss, eating a healthy, low-sodium diet, exercising more, and limiting alcohol. This information is not intended to replace advice given to you by your health care provider. Make sure you discuss any questions you have with your health care provider. Document Released: 03/12/2012 Document Revised: 05/16/2016 Document Reviewed: 05/16/2016 Elsevier Interactive Patient Education  2018 [employer]  

## 2017-10-11 ENCOUNTER — Ambulatory Visit (INDEPENDENT_AMBULATORY_CARE_PROVIDER_SITE_OTHER): Payer: BC Managed Care – PPO | Admitting: Family Medicine

## 2017-10-11 VITALS — BP 125/86 | HR 76

## 2017-10-11 DIAGNOSIS — I10 Essential (primary) hypertension: Secondary | ICD-10-CM | POA: Diagnosis not present

## 2017-10-11 NOTE — Progress Notes (Signed)
Pre visit review using our clinic review tool, if applicable. No additional management support is needed unless otherwise documented below in the visit note.  Pt here today for BP check. She is currently taking HCTZ 25mg  and Amlodipine 5mg .  BP today- L arm 125/86 Pulse: 76  Per Dr. [name]- continue same medications. Follow-up in 3 months.  Pt verbalized understanding.    Donato SchultzYvonne R Lowne Chase, DO

## 2017-10-11 NOTE — Patient Instructions (Signed)
BP looks great! Continue taking same medications. Follow-up with Dr. Laury AxonLowne in 3 months.

## 2017-12-02 ENCOUNTER — Other Ambulatory Visit: Payer: Self-pay | Admitting: Family Medicine

## 2018-03-09 ENCOUNTER — Other Ambulatory Visit: Payer: Self-pay | Admitting: Family Medicine

## 2018-06-11 ENCOUNTER — Other Ambulatory Visit: Payer: Self-pay | Admitting: Family Medicine

## 2018-08-10 ENCOUNTER — Other Ambulatory Visit: Payer: Self-pay | Admitting: Family Medicine

## 2018-08-28 ENCOUNTER — Other Ambulatory Visit: Payer: Self-pay | Admitting: Family Medicine

## 2018-09-01 ENCOUNTER — Encounter: Payer: Self-pay | Admitting: Family Medicine

## 2018-09-01 ENCOUNTER — Ambulatory Visit: Payer: BC Managed Care – PPO | Admitting: Family Medicine

## 2018-09-01 DIAGNOSIS — I10 Essential (primary) hypertension: Secondary | ICD-10-CM | POA: Diagnosis not present

## 2018-09-01 DIAGNOSIS — G43829 Menstrual migraine, not intractable, without status migrainosus: Secondary | ICD-10-CM

## 2018-09-01 MED ORDER — FROVATRIPTAN SUCCINATE 2.5 MG PO TABS: 2.5000 mg | ORAL_TABLET | ORAL | 1 refills | Status: DC | PRN

## 2018-09-01 MED ORDER — HYDROCHLOROTHIAZIDE 25 MG PO TABS: 25.0000 mg | ORAL_TABLET | Freq: Every day | ORAL | 3 refills | Status: DC

## 2018-09-01 MED ORDER — AMLODIPINE BESYLATE 5 MG PO TABS: ORAL_TABLET | ORAL | 1 refills | Status: DC

## 2018-09-01 NOTE — Patient Instructions (Signed)
DASH Eating Plan  DASH stands for "Dietary Approaches to Stop Hypertension." The DASH eating plan is a healthy eating plan that has been shown to reduce high blood pressure (hypertension). It may also reduce your risk for type 2 diabetes, heart disease, and stroke. The DASH eating plan may also help with weight loss.  What are tips for following this plan?    General guidelines   Avoid eating more than 2,300 mg (milligrams) of salt (sodium) a day. If you have hypertension, you may need to reduce your sodium intake to 1,500 mg a day.   Limit alcohol intake to no more than 1 drink a day for nonpregnant women and 2 drinks a day for men. One drink equals 12 oz of beer, 5 oz of wine, or 1 oz of hard liquor.   Work with your health care provider to maintain a healthy body weight or to lose weight. Ask what an ideal weight is for you.   Get at least 30 minutes of exercise that causes your heart to beat faster (aerobic exercise) most days of the week. Activities may include walking, swimming, or biking.   Work with your health care provider or diet and nutrition specialist (dietitian) to adjust your eating plan to your individual calorie needs.  Reading food labels     Check food labels for the amount of sodium per serving. Choose foods with less than 5 percent of the Daily Value of sodium. Generally, foods with less than 300 mg of sodium per serving fit into this eating plan.   To find whole grains, look for the word "whole" as the first word in the ingredient list.  Shopping   Buy products labeled as "low-sodium" or "no salt added."   Buy fresh foods. Avoid canned foods and premade or frozen meals.  Cooking   Avoid adding salt when cooking. Use salt-free seasonings or herbs instead of table salt or sea salt. Check with your health care provider or pharmacist before using salt substitutes.   Do not fry foods. Cook foods using healthy methods such as baking, boiling, grilling, and broiling instead.   Cook with  heart-healthy oils, such as olive, canola, soybean, or sunflower oil.  Meal planning   Eat a balanced diet that includes:  ? 5 or more servings of fruits and vegetables each day. At each meal, try to fill half of your plate with fruits and vegetables.  ? Up to 6-8 servings of whole grains each day.  ? Less than 6 oz of lean meat, poultry, or fish each day. A 3-oz serving of meat is about the same size as a deck of cards. One egg equals 1 oz.  ? 2 servings of low-fat dairy each day.  ? A serving of nuts, seeds, or beans 5 times each week.  ? Heart-healthy fats. Healthy fats called Omega-3 fatty acids are found in foods such as flaxseeds and coldwater fish, like sardines, salmon, and mackerel.   Limit how much you eat of the following:  ? Canned or prepackaged foods.  ? Food that is high in trans fat, such as fried foods.  ? Food that is high in saturated fat, such as fatty meat.  ? Sweets, desserts, sugary drinks, and other foods with added sugar.  ? Full-fat dairy products.   Do not salt foods before eating.   Try to eat at least 2 vegetarian meals each week.   Eat more home-cooked food and less restaurant, buffet, and fast food.     When eating at a restaurant, ask that your food be prepared with less salt or no salt, if possible.  What foods are recommended?  The items listed may not be a complete list. Talk with your dietitian about what dietary choices are best for you.  Grains  Whole-grain or whole-wheat bread. Whole-grain or whole-wheat pasta. Brown rice. Oatmeal. Quinoa. Bulgur. Whole-grain and low-sodium cereals. Pita bread. Low-fat, low-sodium crackers. Whole-wheat flour tortillas.  Vegetables  Fresh or frozen vegetables (raw, steamed, roasted, or grilled). Low-sodium or reduced-sodium tomato and vegetable juice. Low-sodium or reduced-sodium tomato sauce and tomato paste. Low-sodium or reduced-sodium canned vegetables.  Fruits  All fresh, dried, or frozen fruit. Canned fruit in natural juice (without  added sugar).  Meat and other protein foods  Skinless chicken or turkey. Ground chicken or turkey. Pork with fat trimmed off. Fish and seafood. Egg whites. Dried beans, peas, or lentils. Unsalted nuts, nut butters, and seeds. Unsalted canned beans. Lean cuts of beef with fat trimmed off. Low-sodium, lean deli meat.  Dairy  Low-fat (1%) or fat-free (skim) milk. Fat-free, low-fat, or reduced-fat cheeses. Nonfat, low-sodium ricotta or cottage cheese. Low-fat or nonfat yogurt. Low-fat, low-sodium cheese.  Fats and oils  Soft margarine without trans fats. Vegetable oil. Low-fat, reduced-fat, or light mayonnaise and salad dressings (reduced-sodium). Canola, safflower, olive, soybean, and sunflower oils. Avocado.  Seasoning and other foods  Herbs. Spices. Seasoning mixes without salt. Unsalted popcorn and pretzels. Fat-free sweets.  What foods are not recommended?  The items listed may not be a complete list. Talk with your dietitian about what dietary choices are best for you.  Grains  Baked goods made with fat, such as croissants, muffins, or some breads. Dry pasta or rice meal packs.  Vegetables  Creamed or fried vegetables. Vegetables in a cheese sauce. Regular canned vegetables (not low-sodium or reduced-sodium). Regular canned tomato sauce and paste (not low-sodium or reduced-sodium). Regular tomato and vegetable juice (not low-sodium or reduced-sodium). Pickles. Olives.  Fruits  Canned fruit in a light or heavy syrup. Fried fruit. Fruit in cream or butter sauce.  Meat and other protein foods  Fatty cuts of meat. Ribs. Fried meat. Bacon. Sausage. Bologna and other processed lunch meats. Salami. Fatback. Hotdogs. Bratwurst. Salted nuts and seeds. Canned beans with added salt. Canned or smoked fish. Whole eggs or egg yolks. Chicken or turkey with skin.  Dairy  Whole or 2% milk, cream, and half-and-half. Whole or full-fat cream cheese. Whole-fat or sweetened yogurt. Full-fat cheese. Nondairy creamers. Whipped toppings.  Processed cheese and cheese spreads.  Fats and oils  Butter. Stick margarine. Lard. Shortening. Ghee. Bacon fat. Tropical oils, such as coconut, palm kernel, or palm oil.  Seasoning and other foods  Salted popcorn and pretzels. Onion salt, garlic salt, seasoned salt, table salt, and sea salt. Worcestershire sauce. Tartar sauce. Barbecue sauce. Teriyaki sauce. Soy sauce, including reduced-sodium. Steak sauce. Canned and packaged gravies. Fish sauce. Oyster sauce. Cocktail sauce. Horseradish that you find on the shelf. Ketchup. Mustard. Meat flavorings and tenderizers. Bouillon cubes. Hot sauce and Tabasco sauce. Premade or packaged marinades. Premade or packaged taco seasonings. Relishes. Regular salad dressings.  Where to find more information:   National Heart, Lung, and Blood Institute: www.nhlbi.nih.gov   American Heart Association: www.heart.org  Summary   The DASH eating plan is a healthy eating plan that has been shown to reduce high blood pressure (hypertension). It may also reduce your risk for type 2 diabetes, heart disease, and stroke.   With the   DASH eating plan, you should limit salt (sodium) intake to 2,300 mg a day. If you have hypertension, you may need to reduce your sodium intake to 1,500 mg a day.   When on the DASH eating plan, aim to eat more fresh fruits and vegetables, whole grains, lean proteins, low-fat dairy, and heart-healthy fats.   Work with your health care provider or diet and nutrition specialist (dietitian) to adjust your eating plan to your individual calorie needs.  This information is not intended to replace advice given to you by your health care provider. Make sure you discuss any questions you have with your health care provider.  Document Released: 06/07/2011 Document Revised: 06/11/2016 Document Reviewed: 06/11/2016  Elsevier Interactive Patient Education  2019 [employer] Inc.

## 2018-09-01 NOTE — Assessment & Plan Note (Addendum)
Poorly controlled will alter medications, encouraged DASH diet, minimize caffeine and obtain adequate sleep. Report concerning symptoms and follow up as directed and as needed 

## 2018-09-01 NOTE — Assessment & Plan Note (Signed)
Stable con't frova
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
## 2018-09-05 ENCOUNTER — Other Ambulatory Visit (INDEPENDENT_AMBULATORY_CARE_PROVIDER_SITE_OTHER): Payer: BC Managed Care – PPO

## 2018-09-05 DIAGNOSIS — I10 Essential (primary) hypertension: Secondary | ICD-10-CM

## 2018-09-05 LAB — LIPID PANEL
CHOL/HDL RATIO: 4
Cholesterol: 207 mg/dL — ABNORMAL HIGH (ref 0–200)
HDL: 50.6 mg/dL (ref >39.00)
LDL CALC: 141 mg/dL — AB (ref 0–99)
NonHDL: 156.63
TRIGLYCERIDES: 80 mg/dL (ref 0.0–149.0)
VLDL: 16 mg/dL (ref 0.0–40.0)

## 2018-09-05 LAB — COMPREHENSIVE METABOLIC PANEL
ALT: 15 U/L (ref 0–35)
AST: 13 U/L (ref 0–37)
Albumin: 4.2 g/dL (ref 3.5–5.2)
Alkaline Phosphatase: 106 U/L (ref 39–117)
BUN: 11 mg/dL (ref 6–23)
CALCIUM: 9.4 mg/dL (ref 8.4–10.5)
CHLORIDE: 101 meq/L (ref 96–112)
CO2: 28 meq/L (ref 19–32)
CREATININE: 0.97 mg/dL (ref 0.40–1.20)
GFR: 76.8 mL/min (ref >60.00)
Glucose, Bld: 93 mg/dL (ref 70–99)
Potassium: 4.1 mEq/L (ref 3.5–5.1)
SODIUM: 138 meq/L (ref 135–145)
Total Bilirubin: 0.6 mg/dL (ref 0.2–1.2)
Total Protein: 7.3 g/dL (ref 6.0–8.3)

## 2018-09-10 ENCOUNTER — Encounter: Payer: Self-pay | Admitting: *Deleted

## 2019-03-19 ENCOUNTER — Other Ambulatory Visit: Payer: Self-pay | Admitting: Family Medicine

## 2019-03-19 DIAGNOSIS — I10 Essential (primary) hypertension: Secondary | ICD-10-CM

## 2019-06-23 LAB — HM MAMMOGRAPHY

## 2019-06-29 ENCOUNTER — Other Ambulatory Visit: Payer: Self-pay | Admitting: Family Medicine

## 2019-06-29 DIAGNOSIS — I10 Essential (primary) hypertension: Secondary | ICD-10-CM

## 2019-07-27 ENCOUNTER — Encounter: Payer: Self-pay | Admitting: Family Medicine

## 2019-07-27 ENCOUNTER — Ambulatory Visit (INDEPENDENT_AMBULATORY_CARE_PROVIDER_SITE_OTHER): Payer: BC Managed Care – PPO | Admitting: Family Medicine

## 2019-07-27 VITALS — BP 117/103 | Ht 65.0 in

## 2019-07-27 DIAGNOSIS — I10 Essential (primary) hypertension: Secondary | ICD-10-CM

## 2019-07-27 DIAGNOSIS — J0140 Acute pansinusitis, unspecified: Secondary | ICD-10-CM

## 2019-07-27 MED ORDER — FLUTICASONE PROPIONATE 50 MCG/ACT NA SUSP: 2.0000 | Freq: Every day | NASAL | 6 refills | Status: DC

## 2019-07-27 MED ORDER — AMLODIPINE BESYLATE 5 MG PO TABS: ORAL_TABLET | ORAL | 2 refills | Status: DC

## 2019-07-27 MED ORDER — AMOXICILLIN-POT CLAVULANATE 875-125 MG PO TABS: 1.0000 | ORAL_TABLET | Freq: Two times a day (BID) | ORAL | 0 refills | Status: DC

## 2019-07-27 MED ORDER — LEVOCETIRIZINE DIHYDROCHLORIDE 5 MG PO TABS: 5.0000 mg | ORAL_TABLET | Freq: Every evening | ORAL | 5 refills | Status: DC

## 2019-07-27 NOTE — Progress Notes (Signed)
Virtual Visit via Video Note  I connected with Christine Walter on 07/27/19 at 11:40 AM EST by a video enabled telemedicine application and verified that I am speaking with the correct person using two identifiers.  Location: Patient: home alone  Provider: office    I discussed the limitations of evaluation and management by telemedicine and the availability of in person appointments. The patient expressed understanding and agreed to proceed.  History of Present Illness: Pt is home c/o sinus headache and pressure x 1 weeks  Pt taking sudafed and mucinex  No fevers  +cough -- only at night    Observations/Objective: Vitals:   07/27/19 1141  BP: (!) 117/103   Pt is in NAD   Assessment and Plan: 1. Essential hypertension Poorly controlled , encouraged DASH diet, minimize caffeine and obtain adequate sleep. Report concerning symptoms and follow up as directed and as needed - amLODipine (NORVASC) 5 MG tablet; 1 po qd  Dispense: 90 tablet; Refill: 2 Pt was taking sudafed for sinus-- d/w pt the reasons not to take the sudafed  2. Acute non-recurrent pansinusitis abx , flonase and antihistamine  Pt will get covid test  - fluticasone (FLONASE) 50 MCG/ACT nasal spray; Place 2 sprays into both nostrils daily.  Dispense: 16 g; Refill: 6 - levocetirizine (XYZAL) 5 MG tablet; Take 1 tablet (5 mg total) by mouth every evening.  Dispense: 30 tablet; Refill: 5 - amoxicillin-clavulanate (AUGMENTIN) 875-125 MG tablet; Take 1 tablet by mouth 2 (two) times daily.  Dispense: 20 tablet; Refill: 0   Follow Up Instructions:    I discussed the assessment and treatment plan with the patient. The patient was provided an opportunity to ask questions and all were answered. The patient agreed with the plan and demonstrated an understanding of the instructions.   The patient was advised to call back or seek an in-person evaluation if the symptoms worsen or if the condition fails to improve as  anticipated.  I provided 20 minutes of non-face-to-face time during this encounter.   [name], DO

## 2019-12-08 ENCOUNTER — Other Ambulatory Visit: Payer: Self-pay | Admitting: Family Medicine

## 2019-12-08 DIAGNOSIS — I10 Essential (primary) hypertension: Secondary | ICD-10-CM

## 2020-03-28 ENCOUNTER — Other Ambulatory Visit: Payer: Self-pay | Admitting: Family Medicine

## 2020-03-28 DIAGNOSIS — I10 Essential (primary) hypertension: Secondary | ICD-10-CM

## 2020-05-03 ENCOUNTER — Other Ambulatory Visit: Payer: Self-pay | Admitting: Family Medicine

## 2020-05-03 DIAGNOSIS — I10 Essential (primary) hypertension: Secondary | ICD-10-CM

## 2020-05-06 ENCOUNTER — Encounter: Payer: Self-pay | Admitting: Family Medicine

## 2020-05-06 ENCOUNTER — Ambulatory Visit: Payer: BC Managed Care – PPO | Admitting: Family Medicine

## 2020-05-06 ENCOUNTER — Other Ambulatory Visit: Payer: Self-pay

## 2020-05-06 VITALS — BP 140/100 | HR 79 | Temp 98.4°F | Resp 18 | Ht 65.0 in | Wt 248.4 lb

## 2020-05-06 DIAGNOSIS — I10 Essential (primary) hypertension: Secondary | ICD-10-CM

## 2020-05-06 DIAGNOSIS — R700 Elevated erythrocyte sedimentation rate: Secondary | ICD-10-CM | POA: Diagnosis not present

## 2020-05-06 MED ORDER — HYDROCHLOROTHIAZIDE 25 MG PO TABS: 25.0000 mg | ORAL_TABLET | Freq: Every day | ORAL | 1 refills | Status: DC

## 2020-05-06 MED ORDER — AMLODIPINE BESYLATE 5 MG PO TABS: ORAL_TABLET | ORAL | 1 refills | Status: DC

## 2020-05-06 MED ORDER — METOPROLOL SUCCINATE ER 25 MG PO TB24: 25.0000 mg | ORAL_TABLET | Freq: Every day | ORAL | 1 refills | Status: DC

## 2020-05-06 NOTE — Assessment & Plan Note (Signed)
Poorly controlled will alter medications, encouraged DASH diet, minimize caffeine and obtain adequate sleep. Report concerning symptoms and follow up as directed and as needed Add toprol and f/u 2-3 weeks

## 2020-05-06 NOTE — Assessment & Plan Note (Signed)
-   Refer to healthy weight and wellness 
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
## 2020-05-06 NOTE — Patient Instructions (Signed)
DASH Eating Plan DASH stands for "Dietary Approaches to Stop Hypertension." The DASH eating plan is a healthy eating plan that has been shown to reduce high blood pressure (hypertension). It may also reduce your risk for type 2 diabetes, heart disease, and stroke. The DASH eating plan may also help with weight loss. What are tips for following this plan?  General guidelines  Avoid eating more than 2,300 mg (milligrams) of salt (sodium) a day. If you have hypertension, you may need to reduce your sodium intake to 1,500 mg a day.  Limit alcohol intake to no more than 1 drink a day for nonpregnant women and 2 drinks a day for men. One drink equals 12 oz of beer, 5 oz of wine, or 1 oz of hard liquor.  Work with your health care provider to maintain a healthy body weight or to lose weight. Ask what an ideal weight is for you.  Get at least 30 minutes of exercise that causes your heart to beat faster (aerobic exercise) most days of the week. Activities may include walking, swimming, or biking.  Work with your health care provider or diet and nutrition specialist (dietitian) to adjust your eating plan to your individual calorie needs. Reading food labels   Check food labels for the amount of sodium per serving. Choose foods with less than 5 percent of the Daily Value of sodium. Generally, foods with less than 300 mg of sodium per serving fit into this eating plan.  To find whole grains, look for the word "whole" as the first word in the ingredient list. Shopping  Buy products labeled as "low-sodium" or "no salt added."  Buy fresh foods. Avoid canned foods and premade or frozen meals. Cooking  Avoid adding salt when cooking. Use salt-free seasonings or herbs instead of table salt or sea salt. Check with your health care provider or pharmacist before using salt substitutes.  Do not fry foods. Cook foods using healthy methods such as baking, boiling, grilling, and broiling instead.  Cook with  heart-healthy oils, such as olive, canola, soybean, or sunflower oil. Meal planning  Eat a balanced diet that includes: ? 5 or more servings of fruits and vegetables each day. At each meal, try to fill half of your plate with fruits and vegetables. ? Up to 6-8 servings of whole grains each day. ? Less than 6 oz of lean meat, poultry, or fish each day. A 3-oz serving of meat is about the same size as a deck of cards. One egg equals 1 oz. ? 2 servings of low-fat dairy each day. ? A serving of nuts, seeds, or beans 5 times each week. ? Heart-healthy fats. Healthy fats called Omega-3 fatty acids are found in foods such as flaxseeds and coldwater fish, like sardines, salmon, and mackerel.  Limit how much you eat of the following: ? Canned or prepackaged foods. ? Food that is high in trans fat, such as fried foods. ? Food that is high in saturated fat, such as fatty meat. ? Sweets, desserts, sugary drinks, and other foods with added sugar. ? Full-fat dairy products.  Do not salt foods before eating.  Try to eat at least 2 vegetarian meals each week.  Eat more home-cooked food and less restaurant, buffet, and fast food.  When eating at a restaurant, ask that your food be prepared with less salt or no salt, if possible. What foods are recommended? The items listed may not be a complete list. Talk with your dietitian about   what dietary choices are best for you. Grains Whole-grain or whole-wheat bread. Whole-grain or whole-wheat pasta. Brown rice. Oatmeal. Quinoa. Bulgur. Whole-grain and low-sodium cereals. Pita bread. Low-fat, low-sodium crackers. Whole-wheat flour tortillas. Vegetables Fresh or frozen vegetables (raw, steamed, roasted, or grilled). Low-sodium or reduced-sodium tomato and vegetable juice. Low-sodium or reduced-sodium tomato sauce and tomato paste. Low-sodium or reduced-sodium canned vegetables. Fruits All fresh, dried, or frozen fruit. Canned fruit in natural juice (without  added sugar). Meat and other protein foods Skinless chicken or turkey. Ground chicken or turkey. Pork with fat trimmed off. Fish and seafood. Egg whites. Dried beans, peas, or lentils. Unsalted nuts, nut butters, and seeds. Unsalted canned beans. Lean cuts of beef with fat trimmed off. Low-sodium, lean deli meat. Dairy Low-fat (1%) or fat-free (skim) milk. Fat-free, low-fat, or reduced-fat cheeses. Nonfat, low-sodium ricotta or cottage cheese. Low-fat or nonfat yogurt. Low-fat, low-sodium cheese. Fats and oils Soft margarine without trans fats. Vegetable oil. Low-fat, reduced-fat, or light mayonnaise and salad dressings (reduced-sodium). Canola, safflower, olive, soybean, and sunflower oils. Avocado. Seasoning and other foods Herbs. Spices. Seasoning mixes without salt. Unsalted popcorn and pretzels. Fat-free sweets. What foods are not recommended? The items listed may not be a complete list. Talk with your dietitian about what dietary choices are best for you. Grains Baked goods made with fat, such as croissants, muffins, or some breads. Dry pasta or rice meal packs. Vegetables Creamed or fried vegetables. Vegetables in a cheese sauce. Regular canned vegetables (not low-sodium or reduced-sodium). Regular canned tomato sauce and paste (not low-sodium or reduced-sodium). Regular tomato and vegetable juice (not low-sodium or reduced-sodium). Pickles. Olives. Fruits Canned fruit in a light or heavy syrup. Fried fruit. Fruit in cream or butter sauce. Meat and other protein foods Fatty cuts of meat. Ribs. Fried meat. Bacon. Sausage. Bologna and other processed lunch meats. Salami. Fatback. Hotdogs. Bratwurst. Salted nuts and seeds. Canned beans with added salt. Canned or smoked fish. Whole eggs or egg yolks. Chicken or turkey with skin. Dairy Whole or 2% milk, cream, and half-and-half. Whole or full-fat cream cheese. Whole-fat or sweetened yogurt. Full-fat cheese. Nondairy creamers. Whipped toppings.  Processed cheese and cheese spreads. Fats and oils Butter. Stick margarine. Lard. Shortening. Ghee. Bacon fat. Tropical oils, such as coconut, palm kernel, or palm oil. Seasoning and other foods Salted popcorn and pretzels. Onion salt, garlic salt, seasoned salt, table salt, and sea salt. Worcestershire sauce. Tartar sauce. Barbecue sauce. Teriyaki sauce. Soy sauce, including reduced-sodium. Steak sauce. Canned and packaged gravies. Fish sauce. Oyster sauce. Cocktail sauce. Horseradish that you find on the shelf. Ketchup. Mustard. Meat flavorings and tenderizers. Bouillon cubes. Hot sauce and Tabasco sauce. Premade or packaged marinades. Premade or packaged taco seasonings. Relishes. Regular salad dressings. Where to find more information:  National Heart, Lung, and Blood Institute: www.nhlbi.nih.gov  American Heart Association: www.heart.org Summary  The DASH eating plan is a healthy eating plan that has been shown to reduce high blood pressure (hypertension). It may also reduce your risk for type 2 diabetes, heart disease, and stroke.  With the DASH eating plan, you should limit salt (sodium) intake to 2,300 mg a day. If you have hypertension, you may need to reduce your sodium intake to 1,500 mg a day.  When on the DASH eating plan, aim to eat more fresh fruits and vegetables, whole grains, lean proteins, low-fat dairy, and heart-healthy fats.  Work with your health care provider or diet and nutrition specialist (dietitian) to adjust your eating plan to your   individual calorie needs. This information is not intended to replace advice given to you by your health care provider. Make sure you discuss any questions you have with your health care provider. Document Revised: 05/31/2017 Document Reviewed: 06/11/2016 [employer] Patient Education  2020 [employer]  

## 2020-05-07 LAB — COMPREHENSIVE METABOLIC PANEL
AG Ratio: 1.3 (calc) (ref 1.0–2.5)
ALT: 18 U/L (ref 6–29)
AST: 17 U/L (ref 10–30)
Albumin: 4 g/dL (ref 3.6–5.1)
Alkaline phosphatase (APISO): 113 U/L (ref 31–125)
BUN: 9 mg/dL (ref 7–25)
CO2: 27 mmol/L (ref 20–32)
Calcium: 9.1 mg/dL (ref 8.6–10.2)
Chloride: 104 mmol/L (ref 98–110)
Creat: 0.87 mg/dL (ref 0.50–1.10)
Globulin: 3.1 g/dL (calc) (ref 1.9–3.7)
Glucose, Bld: 81 mg/dL (ref 65–99)
Potassium: 4.5 mmol/L (ref 3.5–5.3)
Sodium: 138 mmol/L (ref 135–146)
Total Bilirubin: 0.5 mg/dL (ref 0.2–1.2)
Total Protein: 7.1 g/dL (ref 6.1–8.1)

## 2020-05-07 LAB — LIPID PANEL
Cholesterol: 208 mg/dL — ABNORMAL HIGH (ref <200)
HDL: 50 mg/dL (ref >=50)
LDL Cholesterol (Calc): 140 mg/dL (calc) — ABNORMAL HIGH
Non-HDL Cholesterol (Calc): 158 mg/dL (calc) — ABNORMAL HIGH (ref <130)
Total CHOL/HDL Ratio: 4.2 (calc) (ref <5.0)
Triglycerides: 85 mg/dL (ref <150)

## 2020-05-07 LAB — VITAMIN D 25 HYDROXY (VIT D DEFICIENCY, FRACTURES): Vit D, 25-Hydroxy: 18 ng/mL — ABNORMAL LOW (ref 30–100)

## 2020-05-07 LAB — HEMOGLOBIN A1C
Hgb A1c MFr Bld: 5.3 % of total Hgb (ref <5.7)
Mean Plasma Glucose: 105 (calc)
eAG (mmol/L): 5.8 (calc)

## 2020-05-07 LAB — SEDIMENTATION RATE: Sed Rate: 28 mm/h — ABNORMAL HIGH (ref 0–20)

## 2020-05-09 ENCOUNTER — Other Ambulatory Visit: Payer: Self-pay

## 2020-05-09 LAB — INSULIN, RANDOM: Insulin: 8.2 u[IU]/mL

## 2020-05-09 MED ORDER — VITAMIN D (ERGOCALCIFEROL) 1.25 MG (50000 UNIT) PO CAPS: 50000.0000 [IU] | ORAL_CAPSULE | ORAL | 1 refills | Status: DC

## 2020-07-05 ENCOUNTER — Other Ambulatory Visit: Payer: Self-pay | Admitting: Family Medicine

## 2020-07-05 DIAGNOSIS — I10 Essential (primary) hypertension: Secondary | ICD-10-CM

## 2020-08-03 ENCOUNTER — Other Ambulatory Visit: Payer: Self-pay

## 2020-08-03 ENCOUNTER — Ambulatory Visit (INDEPENDENT_AMBULATORY_CARE_PROVIDER_SITE_OTHER): Payer: BC Managed Care – PPO | Admitting: Family Medicine

## 2020-08-03 ENCOUNTER — Encounter (INDEPENDENT_AMBULATORY_CARE_PROVIDER_SITE_OTHER): Payer: Self-pay | Admitting: Family Medicine

## 2020-08-03 VITALS — BP 117/82 | HR 93 | Temp 97.9°F | Ht 65.0 in | Wt 239.0 lb

## 2020-08-03 DIAGNOSIS — R0602 Shortness of breath: Secondary | ICD-10-CM

## 2020-08-03 DIAGNOSIS — I10 Essential (primary) hypertension: Secondary | ICD-10-CM | POA: Diagnosis not present

## 2020-08-03 DIAGNOSIS — Z9189 Other specified personal risk factors, not elsewhere classified: Secondary | ICD-10-CM | POA: Diagnosis not present

## 2020-08-03 DIAGNOSIS — R5383 Other fatigue: Secondary | ICD-10-CM

## 2020-08-03 DIAGNOSIS — Z6839 Body mass index (BMI) 39.0-39.9, adult: Secondary | ICD-10-CM

## 2020-08-03 DIAGNOSIS — Z0289 Encounter for other administrative examinations: Secondary | ICD-10-CM

## 2020-08-03 DIAGNOSIS — E559 Vitamin D deficiency, unspecified: Secondary | ICD-10-CM | POA: Diagnosis not present

## 2020-08-03 DIAGNOSIS — Z1331 Encounter for screening for depression: Secondary | ICD-10-CM

## 2020-08-03 DIAGNOSIS — E8881 Metabolic syndrome: Secondary | ICD-10-CM

## 2020-08-03 DIAGNOSIS — N261 Atrophy of kidney (terminal): Secondary | ICD-10-CM

## 2020-08-04 LAB — COMPREHENSIVE METABOLIC PANEL
ALT: 20 IU/L (ref 0–32)
AST: 14 IU/L (ref 0–40)
Albumin/Globulin Ratio: 1.4 (ref 1.2–2.2)
Albumin: 4.2 g/dL (ref 3.8–4.8)
Alkaline Phosphatase: 111 IU/L (ref 44–121)
BUN/Creatinine Ratio: 13 (ref 9–23)
BUN: 12 mg/dL (ref 6–24)
Bilirubin Total: 0.6 mg/dL (ref 0.0–1.2)
CO2: 23 mmol/L (ref 20–29)
Calcium: 9.3 mg/dL (ref 8.7–10.2)
Chloride: 102 mmol/L (ref 96–106)
Creatinine, Ser: 0.96 mg/dL (ref 0.57–1.00)
GFR calc Af Amer: 84 mL/min/{1.73_m2} (ref >59)
GFR calc non Af Amer: 73 mL/min/{1.73_m2} (ref >59)
Globulin, Total: 2.9 g/dL (ref 1.5–4.5)
Glucose: 86 mg/dL (ref 65–99)
Potassium: 4.2 mmol/L (ref 3.5–5.2)
Sodium: 138 mmol/L (ref 134–144)
Total Protein: 7.1 g/dL (ref 6.0–8.5)

## 2020-08-04 LAB — FOLATE: Folate: 6.4 ng/mL (ref >3.0)

## 2020-08-04 LAB — TSH: TSH: 1.39 u[IU]/mL (ref 0.450–4.500)

## 2020-08-04 LAB — T3: T3, Total: 130 ng/dL (ref 71–180)

## 2020-08-04 LAB — T4: T4, Total: 7.3 ug/dL (ref 4.5–12.0)

## 2020-08-04 NOTE — Progress Notes (Signed)
Dear Dr. Seabron Spates,   Thank you for referring Christine Walter to our clinic. The following note includes my evaluation and treatment recommendations.  Chief Complaint:   OBESITY Christine Walter (MR# [id number]) is a 45 y.o. female who presents for evaluation and treatment of obesity and related comorbidities. Current BMI is Body mass index is 39.77 kg/m. Christine Walter has been struggling with her weight for many years and has been unsuccessful in either losing weight, maintaining weight loss, or reaching her healthy weight goal.  Christine Walter is currently in the action stage of change and ready to dedicate time achieving and maintaining a healthier weight. Christine Walter is interested in becoming our patient and working on intensive lifestyle modifications including (but not limited to) diet and exercise for weight loss.  Christine Walter was referred by Dr. Zola Button. BF- 4 strips of bacon and 2 scrambled eggs with coffee and 2-3 tbsp sugar (feel satisfied) 10/11am some hunger/some drive to eat Little Debbie cakes, PB cookie pack. Lunch- fast food at [employer] sandwich, M fries, and sweet tea (don't eat all fries/feels full). 3pm snack of peanuts- small sandwich ziplock bag (wanting to eat + hunger). Dinner- 3.5 oz pork chops, green (broccoli), rice (feel satisfied). After dinner- cakes, cookies (want a sweet)  Christine Walter's habits were reviewed today and are as follows: Her family eats meals together, she thinks her family will eat healthier with her, her desired weight loss is 69 lbs, she started gaining weight after the birth of 2nd child, her heaviest weight ever was 243 pounds, she has significant food cravings issues, she snacks frequently in the evenings, she is frequently drinking liquids with calories, she frequently makes poor food choices, she frequently eats larger portions than normal, she has binge eating behaviors and she struggles with emotional eating.  Depression Screen Christine Walter's Food and Mood  (modified PHQ-9) score was 6.  Depression screen Christine Walter 2/9 08/03/2020  Decreased Interest 1  Down, Depressed, Hopeless 0  PHQ - 2 Score 1  Altered sleeping 1  Tired, decreased energy 3  Change in appetite 0  Feeling bad or failure about yourself  0  Trouble concentrating 1  Moving slowly or fidgety/restless 0  Suicidal thoughts 0  PHQ-9 Score 6  Difficult doing work/chores Not difficult at all   Subjective:   1. Other fatigue Christine Walter admits to daytime somnolence and admits to waking up still tired. Patent has a history of symptoms of daytime fatigue. Christine Walter generally gets 6-8 hours of sleep per night, and states that she has generally restful sleep. Snoring is present. Apneic episodes are not present. Epworth Sleepiness Score is 8. EKG showed normal sinus rhythm at 90 BMP.  2. SOB (shortness of breath) on exertion Christine Walter notes increasing shortness of breath with exercising and seems to be worsening over time with weight gain. She notes getting out of breath sooner with activity than she used to. This has gotten worse recently. Christine Walter denies shortness of breath at rest or orthopnea. EKG showed normal sinus rhythm at 90 BMP.  3. Essential hypertension Christine Walter has been diagnosed with hypertension for more than 10 years. She is on amlodipine 5 mg, HCTZ 25 mg, and Metoprolol 25 mg.  4. Vitamin D deficiency Christine Walter's last Vit D level was 18. She is on OTC Vit D.  5. Atrophy of right kidney Renal US and Renal NM imaging flow with Pharm showing 17% function of Right kidney in 2008. Normal kidney function tests.  6. Insulin resistance  Christine Walter's last insulin level was 8.2 and A1c 5.3. She is not on medication.  7. At risk for diabetes mellitus Christine Walter is at higher than average risk for developing diabetes due to obesity.   Assessment/Plan:   1. Other fatigue Christine Walter does feel that her weight is causing her energy to be lower than it should be. Fatigue may be related to obesity, depression or many other  causes. Labs will be ordered, and in the meanwhile, Christine Walter will focus on self care including making healthy food choices, increasing physical activity and focusing on stress reduction. EKG, IC, and labs today.  - EKG 12-Lead - Folate - T3 - T4 - TSH  2. SOB (shortness of breath) on exertion Christine Walter does feel that she gets out of breath more easily that she used to when she exercises. Christine Walter's shortness of breath appears to be obesity related and exercise induced. She has agreed to work on weight loss and gradually increase exercise to treat her exercise induced shortness of breath. Will continue to monitor closely. EKG, IC, and labs today.  3. Essential hypertension Christine Walter is working on healthy weight loss and exercise to improve blood pressure control. We will watch for signs of hypotension as she continues her lifestyle modifications. Follow up BP at next next appt.  4. Vitamin D deficiency Low Vitamin D level contributes to fatigue and are associated with obesity, breast, and colon cancer. She agrees to continue to take OTC Vit D and will follow-up for routine testing of Vitamin D, at least 2-3 times per year to avoid over-replacement. Check Vit D level at next appointment.  5. Atrophy of right kidney Follow up with Dr. Almeta Walter. Repeat CMP in 3 months.  - Comprehensive metabolic panel  6. Insulin resistance Christine Walter will continue to work on weight loss, exercise, and decreasing simple carbohydrates to help decrease the risk of diabetes. Christine Walter agreed to follow-up with [place] as directed to closely monitor her progress. Repeat labs in 3 months.  7. Depression screening Christine Walter had a positive depression screening. Depression is commonly associated with obesity and often results in emotional eating behaviors. We will monitor this closely and work on CBT to help improve the non-hunger eating patterns. Referral to Psychology may be required if no improvement is seen as she continues in our clinic.  8. At  risk for diabetes mellitus Christine Walter was given approximately 15 minutes of diabetes education and counseling today. We discussed intensive lifestyle modifications today with an emphasis on weight loss as well as increasing exercise and decreasing simple carbohydrates in her diet. We also reviewed medication options with an emphasis on risk versus benefit of those discussed.   Repetitive spaced learning was employed today to elicit superior memory formation and behavioral change.  9. Class 2 severe obesity with serious comorbidity and body mass index (BMI) of 39.0 to 39.9 in adult, unspecified obesity type (HCC) Christine Walter is currently in the action stage of change and her goal is to continue with weight loss efforts. I recommend Daleyza begin the structured treatment plan as follows:  She has agreed to the Category 4 Plan.  Exercise goals: No exercise has been prescribed at this time.   Behavioral modification strategies: increasing lean protein intake, meal planning and cooking strategies, keeping healthy foods in the home and planning for success.  She was informed of the importance of frequent follow-up visits to maximize her success with intensive lifestyle modifications for her multiple health conditions. She was informed we would discuss her lab results  at her next visit unless there is a critical issue that needs to be addressed sooner. Christine Walter agreed to keep her next visit at the agreed upon time to discuss these results.  Objective:   Blood pressure 117/82, pulse 93, temperature 97.9 F (36.6 C), temperature source Oral, height 5\' 5"  (1.651 m), weight 239 lb (108.4 kg), last menstrual period 07/17/2020, SpO2 96 %. Body mass index is 39.77 kg/m.  EKG: Normal sinus rhythm, rate 95.  Indirect Calorimeter completed today shows a VO2 of 287 and a REE of 2001.  Her calculated basal metabolic rate is 07/19/2020 thus her basal metabolic rate is better than expected.  General: Cooperative, alert, well  developed, in no acute distress. HEENT: Conjunctivae and lids unremarkable. Cardiovascular: Regular rhythm.  Lungs: Normal work of breathing. Neurologic: No focal deficits.   Lab Results  Component Value Date   CREATININE 0.96 08/03/2020   BUN 12 08/03/2020   NA 138 08/03/2020   K 4.2 08/03/2020   CL 102 08/03/2020   CO2 23 08/03/2020   Lab Results  Component Value Date   ALT 20 08/03/2020   AST 14 08/03/2020   ALKPHOS 111 08/03/2020   BILITOT 0.6 08/03/2020   Lab Results  Component Value Date   HGBA1C 5.3 05/06/2020   No results found for: INSULIN Lab Results  Component Value Date   TSH 1.390 08/03/2020   Lab Results  Component Value Date   CHOL 208 (H) 05/06/2020   HDL 50 05/06/2020   LDLCALC 140 (H) 05/06/2020   TRIG 85 05/06/2020   CHOLHDL 4.2 05/06/2020   Lab Results  Component Value Date   WBC 7.8 09/03/2017   HGB 12.3 09/03/2017   HCT 38.0 09/03/2017   MCV 87.1 09/03/2017   PLT 207.0 09/03/2017   Attestation Statements:   Reviewed by clinician on day of visit: allergies, medications, problem list, medical history, surgical history, family history, social history, and previous encounter notes.  11/03/2017, am acting as transcriptionist for Edmund Hilda, MD.  This is the patient's first visit at [facility]. The patient's NEW PATIENT PACKET was reviewed at length. Included in the packet: current and past health history, medications, allergies, ROS, gynecologic history (women only), surgical history, family history, social history, weight history, weight loss surgery history (for those that have had weight loss surgery), nutritional evaluation, mood and food questionnaire, PHQ9, Epworth questionnaire, sleep habits questionnaire, patient life and health improvement goals questionnaire. These will all be scanned into the patient's chart under media.   During the visit, I independently reviewed the patient's EKG, bioimpedance scale  results, and indirect calorimeter results. I used this information to tailor a meal plan for the patient that will help her to lose weight and will improve her obesity-related conditions going forward. I performed a medically necessary appropriate examination and/or evaluation. I discussed the assessment and treatment plan with the patient. The patient was provided an opportunity to ask questions and all were answered. The patient agreed with the plan and demonstrated an understanding of the instructions. Labs were ordered at this visit and will be reviewed at the next visit unless more critical results need to be addressed immediately. Clinical information was updated and documented in the EMR.   Time spent on visit including pre-visit chart review and post-visit care was 45 minutes.   A separate 15 minutes was spent on risk counseling (see above).  I have reviewed the above documentation for accuracy and completeness, and I agree with  the above. - Katherina Mires, MD

## 2020-08-08 ENCOUNTER — Other Ambulatory Visit: Payer: Self-pay | Admitting: Family Medicine

## 2020-08-08 DIAGNOSIS — I10 Essential (primary) hypertension: Secondary | ICD-10-CM

## 2020-08-12 ENCOUNTER — Other Ambulatory Visit: Payer: Self-pay | Admitting: Family Medicine

## 2020-08-12 DIAGNOSIS — I10 Essential (primary) hypertension: Secondary | ICD-10-CM

## 2020-08-17 ENCOUNTER — Other Ambulatory Visit: Payer: Self-pay

## 2020-08-17 ENCOUNTER — Encounter (INDEPENDENT_AMBULATORY_CARE_PROVIDER_SITE_OTHER): Payer: Self-pay | Admitting: Family Medicine

## 2020-08-17 ENCOUNTER — Ambulatory Visit (INDEPENDENT_AMBULATORY_CARE_PROVIDER_SITE_OTHER): Payer: BC Managed Care – PPO | Admitting: Family Medicine

## 2020-08-17 VITALS — BP 131/88 | HR 70 | Temp 98.4°F | Ht 65.0 in | Wt 239.0 lb

## 2020-08-17 DIAGNOSIS — E559 Vitamin D deficiency, unspecified: Secondary | ICD-10-CM | POA: Diagnosis not present

## 2020-08-17 DIAGNOSIS — E7849 Other hyperlipidemia: Secondary | ICD-10-CM

## 2020-08-17 DIAGNOSIS — I10 Essential (primary) hypertension: Secondary | ICD-10-CM | POA: Diagnosis not present

## 2020-08-17 DIAGNOSIS — Z6839 Body mass index (BMI) 39.0-39.9, adult: Secondary | ICD-10-CM

## 2020-08-17 DIAGNOSIS — E8881 Metabolic syndrome: Secondary | ICD-10-CM

## 2020-08-18 ENCOUNTER — Ambulatory Visit: Payer: Self-pay | Admitting: Cardiovascular Disease

## 2020-08-18 NOTE — Progress Notes (Signed)
Chief Complaint:   OBESITY Christine Walter is here to discuss her progress with her obesity treatment plan along with follow-up of her obesity related diagnoses. Christine Walter is on the Category 4 Plan and states she is following her eating plan approximately 50% of the time. Christine Walter states she is walking 3 miles 2 times per week.  Today's visit was #: 2 Starting weight: 239 lbs Starting date: 08/03/2020 Today's weight: 239 lbs Today's date: 08/17/2020 Total lbs lost to date: 0 Total lbs lost since last in-office visit: 0  Interim History: Pt feels disappointed with lack of weight loss. She is having some non-scale victories. She started walking. She sometimes felt the quantity of food to be too much. No meal felt too large. Pt did occasionally question the quantity of food at meal time. She does seem to have put on approximately 3.8 lbs of water. For snack calories, pt used 100 calorie sweet snacks. She has no plans for the upcoming weeks.  Subjective:   1. Other hyperlipidemia Pt's LDL 140, HDL 50, and triglycerides 85. She is not on meds. She has a 10 year ASCVD risk of 2.5%.  Lab Results  Component Value Date   ALT 20 08/03/2020   AST 14 08/03/2020   ALKPHOS 111 08/03/2020   BILITOT 0.6 08/03/2020   Lab Results  Component Value Date   CHOL 208 (H) 05/06/2020   HDL 50 05/06/2020   LDLCALC 140 (H) 05/06/2020   TRIG 85 05/06/2020   CHOLHDL 4.2 05/06/2020   The 10-year ASCVD risk score Christine George DC Jr., et al., 2013) is: 2.6%   Values used to calculate the score:     Age: 29 years     Sex: Female     Is Non-Hispanic African American: Yes     Diabetic: No     Tobacco smoker: No     Systolic Blood Pressure: 131 mmHg     Is BP treated: Yes     HDL Cholesterol: 50 mg/dL     Total Cholesterol: 208 mg/dL   2. Vitamin D deficiency Pt Vit D level is 18. She denies nausea, vomiting, and muscle weakness but reports fatigue. Pt is on prescription Vit D.  3. Insulin resistance Pt's A1c is 5.3  and insulin level 8.2. she is having some carb cravings.  No results found for: INSULIN Lab Results  Component Value Date   HGBA1C 5.3 05/06/2020    4. Essential hypertension  Pt's BP is well controlled today. Pt denies chest pain, chest pressure and headache.  BP Readings from Last 3 Encounters:  08/17/20 131/88  08/03/20 117/82  05/06/20 (!) 140/100    Assessment/Plan:   1. Other hyperlipidemia Cardiovascular risk and specific lipid/LDL goals reviewed.  We discussed several lifestyle modifications today and Mareesa will continue to work on diet, exercise and weight loss efforts. Orders and follow up as documented in patient record. Repeat labs in 3 months. No medication at this time.  Counseling Intensive lifestyle modifications are the first line treatment for this issue. . Dietary changes: Increase soluble fiber. Decrease simple carbohydrates. . Exercise changes: Moderate to vigorous-intensity aerobic activity 150 minutes per week if tolerated. . Lipid-lowering medications: see documented in medical record.  2. Vitamin D deficiency Low Vitamin D level contributes to fatigue and are associated with obesity, breast, and colon cancer. She agrees to continue to take prescription Vitamin D @50 ,000 IU every week and will follow-up for routine testing of Vitamin D, at least 2-3 times per  year to avoid over-replacement. Recheck Vit D at next appointment.  3. Insulin resistance Christine Walter will continue to work on weight loss, exercise, and decreasing simple carbohydrates to help decrease the risk of diabetes. Christine Walter agreed to follow-up with [place] as directed to closely monitor her progress. Repeat labs in 3 months.  4. Essential hypertension [name] is working on healthy weight loss and exercise to improve blood pressure control. We will watch for signs of hypotension as she continues her lifestyle modifications. Follow up BP at next appointment. No change in meds at this time.  5. Class 2 severe  obesity with serious comorbidity and body mass index (BMI) of 39.0 to 39.9 in adult, unspecified obesity type (HCC) Christine Walter is currently in the action stage of change. As such, her goal is to continue with weight loss efforts. She has agreed to the Category 3 Plan + 100 calories.   Exercise goals: All adults should avoid inactivity. Some physical activity is better than none, and adults who participate in any amount of physical activity gain some health benefits.  Behavioral modification strategies: increasing lean protein intake, meal planning and cooking strategies, keeping healthy foods in the home and planning for success.  Christine Walter has agreed to follow-up with our clinic in 2 weeks. She was informed of the importance of frequent follow-up visits to maximize her success with intensive lifestyle modifications for her multiple health conditions.   Objective:   Blood pressure 131/88, pulse 70, temperature 98.4 F (36.9 C), temperature source Oral, height 5\' 5"  (1.651 m), weight 239 lb (108.4 kg), last menstrual period 08/08/2020, SpO2 98 %. Body mass index is 39.77 kg/m.  General: Cooperative, alert, well developed, in no acute distress. HEENT: Conjunctivae and lids unremarkable. Cardiovascular: Regular rhythm.  Lungs: Normal work of breathing. Neurologic: No focal deficits.   Lab Results  Component Value Date   CREATININE 0.96 08/03/2020   BUN 12 08/03/2020   NA 138 08/03/2020   K 4.2 08/03/2020   CL 102 08/03/2020   CO2 23 08/03/2020   Lab Results  Component Value Date   ALT 20 08/03/2020   AST 14 08/03/2020   ALKPHOS 111 08/03/2020   BILITOT 0.6 08/03/2020   Lab Results  Component Value Date   HGBA1C 5.3 05/06/2020   No results found for: INSULIN Lab Results  Component Value Date   TSH 1.390 08/03/2020   Lab Results  Component Value Date   CHOL 208 (H) 05/06/2020   HDL 50 05/06/2020   LDLCALC 140 (H) 05/06/2020   TRIG 85 05/06/2020   CHOLHDL 4.2 05/06/2020   Lab  Results  Component Value Date   WBC 7.8 09/03/2017   HGB 12.3 09/03/2017   HCT 38.0 09/03/2017   MCV 87.1 09/03/2017   PLT 207.0 09/03/2017    Attestation Statements:   Reviewed by clinician on day of visit: allergies, medications, problem list, medical history, surgical history, family history, social history, and previous encounter notes.  Time spent on visit including pre-visit chart review and post-visit care and charting was 25 minutes.   11/03/2017, am acting as transcriptionist for Edmund Hilda, MD.   I have reviewed the above documentation for accuracy and completeness, and I agree with the above. - Reuben Likes, MD

## 2020-08-18 NOTE — Progress Notes (Deleted)
Hypertension Clinic Initial Assessment:    Date:  08/18/2020   ID:  Christine Walter, DOB 08-Sep-1975, MRN [id number]  PCP:  Zola Button, Grayling Congress, DO  Cardiologist:  No primary care provider on file.  Nephrologist:  Referring MD: Maxie Better, MD   CC: Hypertension  History of Present Illness:    Christine Walter is a 45 y.o. female with a hx of hypertension, morbid obesity here to establish care in the hypertension clinic.   She last saw Dr. Zola Button on 05/2020. At that time she was on amlodipine and hydrochlorothiazide. Metoprolol was added due to poorly controlled hypertension. She was also referred to the [facility] and has been working with them. She saw Dr. Cherly Hensen on 07/2020 and her blood pressure was 120/84.   Previous antihypertensives:  Secondary Causes of Hypertension  Medications/Herbal: OCP, steroids, stimulants, antidepressants, weight loss medication, immune suppressants, NSAIDs, sympathomimetics, alcohol, caffeine, licorice, ginseng, St. John's wort, chemo  Sleep Apnea Renal artery stenosis Hyperaldosteronism Hyper/hypothyroidism Pheochromocytoma: palpitations, tachycardia, headache, diaphoresis (plasma metanephrines) Cushing's syndrome: Cushingoid facies, central obesity, proximal muscle weakness, and ecchymoses, adrenal incidentaloma (cortisol) Coarctation of the aorta  Past Medical History:  Diagnosis Date  . Hypertension   . Vitamin D deficiency     Past Surgical History:  Procedure Laterality Date  . CHOLECYSTECTOMY    . DILATION AND EVACUATION    . kidney stent      Current Medications: No outpatient medications have been marked as taking for the 08/18/20 encounter (Appointment) with Chilton Si, MD.     Allergies:   Patient has no known allergies.   Social History   Socioeconomic History  . Marital status: Married    Spouse name: Not on file  . Number of children: Not on file  . Years of  education: Not on file  . Highest education level: Not on file  Occupational History  . 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[employer]    Comment: Business Service  Tobacco Use  . Smoking status: Never Smoker  . Smokeless tobacco: Never Used  Substance and Sexual Activity  . Alcohol use: No    Alcohol/week: 0.0 standard drinks    Comment: rare  . Drug use: No  . Sexual activity: Yes    Partners: Male  Other Topics Concern  . Not on file  Social History Narrative   Exercise --- no-- but a walking challenge has started at work   Social Determinants of Corporate investment banker Strain: Not on file  Food Insecurity: Not on file  Transportation Needs: Not on file  Physical Activity: Not on file  Stress: Not on file  Social Connections: Not on file     Family History: The patient's ***family history includes Diabetes in her mother; Hypertension in her mother; Kidney disease in her paternal grandfather.  ROS:   Please see the history of present illness.    *** All other systems reviewed and are negative.  EKGs/Labs/Other Studies Reviewed:    EKG:  EKG is *** ordered today.  The ekg ordered today demonstrates ***  Recent Labs: 08/03/2020: ALT 20; BUN 12; Creatinine, Ser 0.96; Potassium 4.2; Sodium 138; TSH 1.390   Recent Lipid Panel    Component Value Date/Time   CHOL 208 (H) 05/06/2020 0951   TRIG 85 05/06/2020 0951   HDL 50 05/06/2020 0951   CHOLHDL 4.2 05/06/2020 0951   VLDL 16.0 09/05/2018 0807   LDLCALC 140 (H) 05/06/2020  6440    Physical Exam:    VS:  LMP 08/08/2020     Wt Readings from Last 3 Encounters:  08/17/20 239 lb (108.4 kg)  08/03/20 239 lb (108.4 kg)  05/06/20 248 lb 6.4 oz (112.7 kg)     GEN: *** Well nourished, well developed in no acute distress HEENT: Normal NECK: No JVD; No carotid bruits LYMPHATICS: No lymphadenopathy CARDIAC: ***RRR, no murmurs, rubs, gallops RESPIRATORY:  Clear to auscultation without rales, wheezing or  rhonchi  ABDOMEN: Soft, non-tender, non-distended MUSCULOSKELETAL:  No edema; No deformity  SKIN: Warm and dry NEUROLOGIC:  Alert and oriented x 3 PSYCHIATRIC:  Normal affect   ASSESSMENT:    No diagnosis found.  PLAN:    1. ***  she consents to be monitored in our remote patient monitoring program through [employer].  she will track his blood pressure twice daily and understands that these trends will help [place] to adjust her medications as needed prior to his next appointment.  she *** interested in enrolling in the PREP exercise and nutrition program through the [facility].     Disposition:    FU with MD/PharmD in {gen number [id number]} {Days to years:10300} {virtual or in-office}   Medication Adjustments/Labs and Tests Ordered: Current medicines are reviewed at length with the patient today.  Concerns regarding medicines are outlined above.  No orders of the defined types were placed in this encounter.  No orders of the defined types were placed in this encounter.    Signed, Chilton Si, MD  08/18/2020 8:11 AM    [facility]

## 2020-08-25 ENCOUNTER — Other Ambulatory Visit: Payer: Self-pay | Admitting: Family Medicine

## 2020-08-25 DIAGNOSIS — I10 Essential (primary) hypertension: Secondary | ICD-10-CM

## 2020-08-31 ENCOUNTER — Ambulatory Visit (INDEPENDENT_AMBULATORY_CARE_PROVIDER_SITE_OTHER): Payer: BC Managed Care – PPO | Admitting: Family Medicine

## 2020-09-02 ENCOUNTER — Ambulatory Visit (INDEPENDENT_AMBULATORY_CARE_PROVIDER_SITE_OTHER): Payer: BC Managed Care – PPO | Admitting: Cardiovascular Disease

## 2020-09-02 ENCOUNTER — Other Ambulatory Visit: Payer: Self-pay

## 2020-09-02 ENCOUNTER — Encounter: Payer: Self-pay | Admitting: Cardiovascular Disease

## 2020-09-02 DIAGNOSIS — E7800 Pure hypercholesterolemia, unspecified: Secondary | ICD-10-CM | POA: Diagnosis not present

## 2020-09-02 DIAGNOSIS — I10 Essential (primary) hypertension: Secondary | ICD-10-CM | POA: Diagnosis not present

## 2020-09-02 HISTORY — DX: Pure hypercholesterolemia, unspecified: E78.00

## 2020-09-02 MED ORDER — AMLODIPINE BESYLATE 10 MG PO TABS: 10.0000 mg | ORAL_TABLET | Freq: Every day | ORAL | 3 refills | Status: DC

## 2020-09-02 MED ORDER — METOPROLOL SUCCINATE ER 25 MG PO TB24: ORAL_TABLET | ORAL | 3 refills | Status: DC

## 2020-09-02 NOTE — Patient Instructions (Signed)
Medication Instructions:  INCREASE YOUR AMLODIPINE TO 10 MG DAILY    Labwork: RENIN/ALDOSTERONE LEVELS TODAY    Testing/Procedures: Your physician has requested that you have a renal artery duplex. During this test, an ultrasound is used to evaluate blood flow to the kidneys. Allow one hour for this exam. Do not eat after midnight the day before and avoid carbonated beverages. Take your medications as you usually do.   Follow-Up: 10/07/2020 AT 9:30 AM    You will receive a phone call from the [facility] exercise and nutrition program to schedule an initial assessment.   Special Instructions:   MYFITNESSPAL APP   MONITOR YOUR BLOOD PRESSURE TWICE A DAY, LOG IN THE BOOK PROVIDED. BRING THE BOOK AND YOUR BLOOD PRESSURE MACHINE TO YOUR FOLLOW UP IN 1 MONTH   DASH Eating Plan DASH stands for "Dietary Approaches to Stop Hypertension." The DASH eating plan is a healthy eating plan that has been shown to reduce high blood pressure (hypertension). It may also reduce your risk for type 2 diabetes, heart disease, and stroke. The DASH eating plan may also help with weight loss. What are tips for following this plan?  General guidelines  Avoid eating more than 2,300 mg (milligrams) of salt (sodium) a day. If you have hypertension, you may need to reduce your sodium intake to 1,500 mg a day.  Limit alcohol intake to no more than 1 drink a day for nonpregnant women and 2 drinks a day for men. One drink equals 12 oz of beer, 5 oz of wine, or 1 oz of hard liquor.  Work with your health care provider to maintain a healthy body weight or to lose weight. Ask what an ideal weight is for you.  Get at least 30 minutes of exercise that causes your heart to beat faster (aerobic exercise) most days of the week. Activities may include walking, swimming, or biking.  Work with your health care provider or diet and nutrition specialist (dietitian) to adjust your eating plan to your individual calorie needs. Reading  food labels   Check food labels for the amount of sodium per serving. Choose foods with less than 5 percent of the Daily Value of sodium. Generally, foods with less than 300 mg of sodium per serving fit into this eating plan.  To find whole grains, look for the word "whole" as the first word in the ingredient list. Shopping  Buy products labeled as "low-sodium" or "no salt added."  Buy fresh foods. Avoid canned foods and premade or frozen meals. Cooking  Avoid adding salt when cooking. Use salt-free seasonings or herbs instead of table salt or sea salt. Check with your health care provider or pharmacist before using salt substitutes.  Do not fry foods. Cook foods using healthy methods such as baking, boiling, grilling, and broiling instead.  Cook with heart-healthy oils, such as olive, canola, soybean, or sunflower oil. Meal planning  Eat a balanced diet that includes: ? 5 or more servings of fruits and vegetables each day. At each meal, try to fill half of your plate with fruits and vegetables. ? Up to 6-8 servings of whole grains each day. ? Less than 6 oz of lean meat, poultry, or fish each day. A 3-oz serving of meat is about the same size as a deck of cards. One egg equals 1 oz. ? 2 servings of low-fat dairy each day. ? A serving of nuts, seeds, or beans 5 times each week. ? Heart-healthy fats. Healthy fats called Omega-3  fatty acids are found in foods such as flaxseeds and coldwater fish, like sardines, salmon, and mackerel.  Limit how much you eat of the following: ? Canned or prepackaged foods. ? Food that is high in trans fat, such as fried foods. ? Food that is high in saturated fat, such as fatty meat. ? Sweets, desserts, sugary drinks, and other foods with added sugar. ? Full-fat dairy products.  Do not salt foods before eating.  Try to eat at least 2 vegetarian meals each week.  Eat more home-cooked food and less restaurant, buffet, and fast food.  When eating at  a restaurant, ask that your food be prepared with less salt or no salt, if possible. What foods are recommended? The items listed may not be a complete list. Talk with your dietitian about what dietary choices are best for you. Grains Whole-grain or whole-wheat bread. Whole-grain or whole-wheat pasta. Brown rice. Orpah Cobb. Bulgur. Whole-grain and low-sodium cereals. Pita bread. Low-fat, low-sodium crackers. Whole-wheat flour tortillas. Vegetables Fresh or frozen vegetables (raw, steamed, roasted, or grilled). Low-sodium or reduced-sodium tomato and vegetable juice. Low-sodium or reduced-sodium tomato sauce and tomato paste. Low-sodium or reduced-sodium canned vegetables. Fruits All fresh, dried, or frozen fruit. Canned fruit in natural juice (without added sugar). Meat and other protein foods Skinless chicken or [place]. Ground chicken or [place]. Pork with fat trimmed off. Fish and seafood. Egg whites. Dried beans, peas, or lentils. Unsalted nuts, nut butters, and seeds. Unsalted canned beans. Lean cuts of beef with fat trimmed off. Low-sodium, lean deli meat. Dairy Low-fat (1%) or fat-free (skim) milk. Fat-free, low-fat, or reduced-fat cheeses. Nonfat, low-sodium ricotta or cottage cheese. Low-fat or nonfat yogurt. Low-fat, low-sodium cheese. Fats and oils Soft margarine without trans fats. Vegetable oil. Low-fat, reduced-fat, or light mayonnaise and salad dressings (reduced-sodium). Canola, safflower, olive, soybean, and sunflower oils. Avocado. Seasoning and other foods Herbs. Spices. Seasoning mixes without salt. Unsalted popcorn and pretzels. Fat-free sweets. What foods are not recommended? The items listed may not be a complete list. Talk with your dietitian about what dietary choices are best for you. Grains Baked goods made with fat, such as croissants, muffins, or some breads. Dry pasta or rice meal packs. Vegetables Creamed or fried vegetables. Vegetables in a cheese sauce.  Regular canned vegetables (not low-sodium or reduced-sodium). Regular canned tomato sauce and paste (not low-sodium or reduced-sodium). Regular tomato and vegetable juice (not low-sodium or reduced-sodium). Rosita Fire. Olives. Fruits Canned fruit in a light or heavy syrup. Fried fruit. Fruit in cream or butter sauce. Meat and other protein foods Fatty cuts of meat. Ribs. Fried meat. Tomasa Blase. Sausage. Bologna and other processed lunch meats. Salami. Fatback. Hotdogs. Bratwurst. Salted nuts and seeds. Canned beans with added salt. Canned or smoked fish. Whole eggs or egg yolks. Chicken or [place] with skin. Dairy Whole or 2% milk, cream, and half-and-half. Whole or full-fat cream cheese. Whole-fat or sweetened yogurt. Full-fat cheese. Nondairy creamers. Whipped toppings. Processed cheese and cheese spreads. Fats and oils Butter. Stick margarine. Lard. Shortening. Ghee. Bacon fat. Tropical oils, such as coconut, palm kernel, or palm oil. Seasoning and other foods Salted popcorn and pretzels. Onion salt, garlic salt, seasoned salt, table salt, and sea salt. Worcestershire sauce. Tartar sauce. Barbecue sauce. Teriyaki sauce. Soy sauce, including reduced-sodium. Steak sauce. Canned and packaged gravies. Fish sauce. Oyster sauce. Cocktail sauce. Horseradish that you find on the shelf. Ketchup. Mustard. Meat flavorings and tenderizers. Bouillon cubes. Hot sauce and Tabasco sauce. Premade or packaged marinades. Premade or  packaged taco seasonings. Relishes. Regular salad dressings. Where to find more information:  National Heart, Lung, and Blood Institute: PopSteam.is  American Heart Association: www.heart.org Summary  The DASH eating plan is a healthy eating plan that has been shown to reduce high blood pressure (hypertension). It may also reduce your risk for type 2 diabetes, heart disease, and stroke.  With the DASH eating plan, you should limit salt (sodium) intake to 2,300 mg a day. If you have  hypertension, you may need to reduce your sodium intake to 1,500 mg a day.  When on the DASH eating plan, aim to eat more fresh fruits and vegetables, whole grains, lean proteins, low-fat dairy, and heart-healthy fats.  Work with your health care provider or diet and nutrition specialist (dietitian) to adjust your eating plan to your individual calorie needs. This information is not intended to replace advice given to you by your health care provider. Make sure you discuss any questions you have with your health care provider. Document Released: 06/07/2011 Document Revised: 05/31/2017 Document Reviewed: 06/11/2016 Elsevier Patient Education  2020 [employer].

## 2020-09-02 NOTE — Progress Notes (Signed)
Hypertension Clinic Initial Assessment:    Date:  09/02/2020   ID:  Christine Walter, DOB 1976-04-18, MRN [id number]  PCP:  Zola Button, Grayling Congress, DO  Cardiologist:  No primary care provider on file.  Nephrologist:  Referring MD: Maxie Better, MD   CC: Hypertension  History of Present Illness:    Christine Walter is a 45 y.o. female with a hx of hypertension, morbid obesity here to establish care in the hypertension clinic. She was first diganosed with hypertension in her early 2s.  She had kidney stones at the time.  She had two preganies 17/14 years ago and BP was well-controlled until recently.  She attributes this to weight gain.    She last saw Dr. Zola Button on 05/2020. At that time she was on amlodipine and hydrochlorothiazide. Metoprolol was added due to poorly controlled hypertension. She was also referred to the healthy weight and wellness clinic and has been working with them. She saw Dr. Cherly Hensen on 07/2020 and her blood pressure was 120/84.  She checks her BP at home sporadically and her DBP is in the 80s.  She is unsure about the SBP. She hasn't been on metoprolol in the last couple of weeks.  She requested a refill but hasn't gotten it. She was previously on losartan but stopped due to national recall.    She is starting to walk some for exercise.  She walks for three miles three days per week.  She feels good and has no exertional chest pain or shortness of She started the [facility].  She struggles with the available food selections. She has no LE edema, orthopnea or PND.  She notes that she does snore but does not have apnea.  She feels well-rested in the morning and does not have daytime somnolence.  She is trying to cook more.  She does struggle with salt intake.  She does not use any over-the-counter supplements other than vitamin D and does not use any NSAIDs.   Previous antihypertensives: Metoprolol Losartan   Past Medical History:   Diagnosis Date  . Hypertension   . Pure hypercholesterolemia 09/02/2020  . Vitamin D deficiency     Past Surgical History:  Procedure Laterality Date  . CHOLECYSTECTOMY    . DILATION AND EVACUATION    . kidney stent      Current Medications: Current Meds  Medication Sig  . calcium-vitamin D (OSCAL WITH D) 500-200 MG-UNIT tablet Take 1 tablet by mouth.  . fluticasone (FLONASE) 50 MCG/ACT nasal spray Place 2 sprays into both nostrils daily.  . hydrochlorothiazide (HYDRODIURIL) 25 MG tablet Take 1 tablet (25 mg total) by mouth daily.  . Vitamin D, Ergocalciferol, (DRISDOL) 1.25 MG (50000 UNIT) CAPS capsule Take 1 capsule (50,000 Units total) by mouth every 7 (seven) days.  . [DISCONTINUED] amLODipine (NORVASC) 5 MG tablet 1 po qd  . [DISCONTINUED] metoprolol succinate (TOPROL-XL) 25 MG 24 hr tablet TAKE 1 TABLET BY MOUTH ONCE DAILY . APPOINTMENT REQUIRED FOR FUTURE REFILLS     Allergies:   Patient has no known allergies.   Social History   Socioeconomic History  . Marital status: Married    Spouse name: Not on file  . Number of children: Not on file  . Years of education: Not on file  . Highest education level: Not on file  Occupational History  . 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[employer]    Comment: Business Service  Tobacco Use  .  Smoking status: Never Smoker  . Smokeless tobacco: Never Used  Substance and Sexual Activity  . Alcohol use: No    Alcohol/week: 0.0 standard drinks    Comment: rare  . Drug use: No  . Sexual activity: Yes    Partners: Male  Other Topics Concern  . Not on file  Social History Narrative   Exercise --- no-- but a walking challenge has started at work   Social Determinants of Corporate investment banker Strain: Not on file  Food Insecurity: Not on file  Transportation Needs: Not on file  Physical Activity: Insufficiently Active  . Days of Exercise per Week: 3 days  . Minutes of Exercise per Session: 30 min  Stress: Not  on file  Social Connections: Not on file     Family History: The patient's family history includes Diabetes in her mother; Hypertension in her mother; Kidney disease in her maternal grandmother and paternal grandfather; Stroke in her maternal aunt and maternal uncle.  ROS:   Please see the history of present illness.    All other systems reviewed and are negative.  EKGs/Labs/Other Studies Reviewed:    EKG:  EKG is not ordered today.  The ekg ordered 08/03/20 demonstrates sinus rhythm.  Rate 90 bpm.  Recent Labs: 08/03/2020: ALT 20; BUN 12; Creatinine, Ser 0.96; Potassium 4.2; Sodium 138; TSH 1.390   Recent Lipid Panel    Component Value Date/Time   CHOL 208 (H) 05/06/2020 0951   TRIG 85 05/06/2020 0951   HDL 50 05/06/2020 0951   CHOLHDL 4.2 05/06/2020 0951   VLDL 16.0 09/05/2018 0807   LDLCALC 140 (H) 05/06/2020 0951    Physical Exam:    VS:  BP (!) 140/102 (BP Location: Left Arm, Patient Position: Sitting, Cuff Size: Large)   Pulse 99   Ht 5\' 5"  (1.651 m)   Wt 243 lb (110.2 kg)   LMP 08/08/2020   SpO2 (!) 86%   BMI 40.44 kg/m  , BMI Body mass index is 40.44 kg/m. GENERAL:  Well appearing HEENT: Pupils equal round and reactive, fundi not visualized, oral mucosa unremarkable NECK:  No jugular venous distention, waveform within normal limits, carotid upstroke brisk and symmetric, no bruits LUNGS:  Clear to auscultation bilaterally HEART:  RRR.  PMI not displaced or sustained,S1 and S2 within normal limits, no S3, no S4, no clicks, no rubs, no murmurs ABD:  Flat, positive bowel sounds normal in frequency in pitch, no bruits, no rebound, no guarding, no midline pulsatile mass, no hepatomegaly, no splenomegaly EXT:  2 plus pulses throughout, no edema, no cyanosis no clubbing SKIN:  No rashes no nodules NEURO:  Cranial nerves II through XII grossly intact, motor grossly intact throughout PSYCH:  Cognitively intact, oriented to person place and time   ASSESSMENT:    1.  Morbid obesity (HCC)   2. Essential hypertension   3. Pure hypercholesterolemia     PLAN:    #Resistant hypertension:  Blood pressure remains poorly controlled on 3 agents.  She is interested in being on his few medications as possible.  We will stop metoprolol, plan to increase amlodipine to 10 mg and continue HCTZ.  She is interested in enrolling in the PR EP program through the [facility]  We also discussed limiting sodium intake to no more than 1500 mg daily.  If she is unable to do this at first, she will reduce by 1000mg .  We discussed exercise weekly for at least 150 minutes.  Heart function has  been normal.  We will check renal and aldosterone levels as well.  Secondary Causes of Hypertension  Medications/Herbal: OCP, steroids, stimulants, antidepressants, weight loss medication, immune suppressants, NSAIDs, sympathomimetics, alcohol, caffeine, licorice, ginseng, St. John's wort, chemo  Sleep Apnea Renal artery stenosis Hyperaldosteronism Hyper/hypothyroidism Pheochromocytoma: palpitations, tachycardia, headache, diaphoresis (plasma metanephrines) Cushing's syndrome: Cushingoid facies, central obesity, proximal muscle weakness, and ecchymoses, adrenal incidentaloma (cortisol) Coarctation of the aorta   # Hyperlipidemia:   Lipids are above goal.  However if she continues working on diet and exercise as above this will likely no indication for pharmacological intervention at this time.  ASCVD 10-year risk is less than 10.  Disposition:    FU with MD/PharmD in 1 month    Medication Adjustments/Labs and Tests Ordered: Current medicines are reviewed at length with the patient today.  Concerns regarding medicines are outlined above.  Orders Placed This Encounter  Procedures  . Aldosterone + renin activity w/ ratio  . VAS US RENAL ARTERY DUPLEX   Meds ordered this encounter  Medications  . metoprolol succinate (TOPROL-XL) 25 MG 24 hr tablet    Sig: TAKE 1 TABLET BY MOUTH ONCE DAILY  .    Dispense:  90 tablet    Refill:  3  . amLODipine (NORVASC) 10 MG tablet    Sig: Take 1 tablet (10 mg total) by mouth daily.    Dispense:  90 tablet    Refill:  3    NEW DOSE, D/C 5 MG RX PATIENT WILL CALL WHEN NEEDS FILLED     Signed, Chilton Si, MD  09/02/2020 7:02 PM    [facility]

## 2020-09-05 ENCOUNTER — Telehealth: Payer: Self-pay

## 2020-09-05 NOTE — Telephone Encounter (Signed)
LVMT requesting call back to discuss referral to [facility] 

## 2020-09-07 ENCOUNTER — Encounter (INDEPENDENT_AMBULATORY_CARE_PROVIDER_SITE_OTHER): Payer: Self-pay

## 2020-09-07 ENCOUNTER — Ambulatory Visit (INDEPENDENT_AMBULATORY_CARE_PROVIDER_SITE_OTHER): Payer: BC Managed Care – PPO | Admitting: Family Medicine

## 2020-09-19 ENCOUNTER — Ambulatory Visit (HOSPITAL_COMMUNITY)
Admission: RE | Admit: 2020-09-19 | Payer: BC Managed Care – PPO | Source: Ambulatory Visit | Attending: Cardiovascular Disease | Admitting: Cardiovascular Disease

## 2020-09-27 LAB — ALDOSTERONE + RENIN ACTIVITY W/ RATIO
ALDOS/RENIN RATIO: 8.4 (ref 0.0–30.0)
ALDOSTERONE: 22.7 ng/dL (ref 0.0–30.0)
Renin: 2.71 ng/mL/hr (ref 0.167–5.380)

## 2020-10-06 ENCOUNTER — Other Ambulatory Visit: Payer: Self-pay | Admitting: Family Medicine

## 2020-10-06 DIAGNOSIS — I10 Essential (primary) hypertension: Secondary | ICD-10-CM

## 2020-10-07 ENCOUNTER — Ambulatory Visit: Payer: BC Managed Care – PPO

## 2020-11-07 ENCOUNTER — Ambulatory Visit (INDEPENDENT_AMBULATORY_CARE_PROVIDER_SITE_OTHER): Payer: BC Managed Care – PPO | Admitting: Pharmacist

## 2020-11-07 ENCOUNTER — Other Ambulatory Visit: Payer: Self-pay

## 2020-11-07 VITALS — BP 122/84 | HR 102 | Resp 17 | Ht 65.0 in | Wt 246.0 lb

## 2020-11-07 DIAGNOSIS — I10 Essential (primary) hypertension: Secondary | ICD-10-CM

## 2020-11-07 MED ORDER — METOPROLOL SUCCINATE ER 25 MG PO TB24: 12.5000 mg | ORAL_TABLET | Freq: Every day | ORAL | 0 refills | Status: DC

## 2020-11-07 MED ORDER — CHLORTHALIDONE 25 MG PO TABS: 25.0000 mg | ORAL_TABLET | Freq: Every day | ORAL | 1 refills | Status: DC

## 2020-11-07 NOTE — Patient Instructions (Addendum)
Return for a  follow up appointment in 5 weeks  Check your blood pressure at home daily (if able) and keep record of the readings.  Take your BP meds as follows: *STOP taking HCTZ  *START taking chlorthalidone 25mg  daily *Take metoprolol succinate 12.5mg  daily (1/2 tablet every day)*  Bring all of your meds, your BP cuff and your record of home blood pressures to your next appointment.  Exercise as you're able, try to walk approximately 30 minutes per day.  Keep salt intake to a minimum, especially watch canned and prepared boxed foods.  Eat more fresh fruits and vegetables and fewer canned items.  Avoid eating in fast food restaurants.    HOW TO TAKE YOUR BLOOD PRESSURE: . Rest 5 minutes before taking your blood pressure. .  Don't smoke or drink caffeinated beverages for at least 30 minutes before. . Take your blood pressure before (not after) you eat. . Sit comfortably with your back supported and both feet on the floor (don't cross your legs). . Elevate your arm to heart level on a table or a desk. . Use the proper sized cuff. It should fit smoothly and snugly around your bare upper arm. There should be enough room to slip a fingertip under the cuff. The bottom edge of the cuff should be 1 inch above the crease of the elbow. . Ideally, take 3 measurements at one sitting and record the average.

## 2020-11-07 NOTE — Progress Notes (Signed)
Patient ID: Christine Walter                 DOB: 1976-02-11                      MRN: [id number]     HPI: Christine Walter is a 45 y.o. female referred by Dr. Duke Salvia  to HTN clinic. PMH includes hypertension, morbid obesity, hx of kidney stones. Dx with hypertension in her early 30s.  Headaches with elevated BP high 130s - 140, otherwise no symptoms. Referred to healthy weight an wellness. - last visit on feb/16/2022, but patient expresses interest to go back. She also reports taking metoprolol   Current HTN meds:  Amlodipine 10mg  daily - morning HCTZ 25mg  daily - morning Metoprolol succinate 25mg  daily - PRN for elevated BP   BP goal: <130/80  Family History: family history includes Diabetes in her mother; Hypertension in her mother; Kidney disease in her maternal grandmother and paternal grandfather; Stroke in her maternal aunt and maternal uncle.  Diet: 32 onces of water (1-2 per day), cutting back on sweets, doing less additional salts (not adding salt to cooked meals) changing from garlic salt to garlic powder, etc, not lots take out  Exercise: 2-3 times per week, [place]3 miles [place]  Home BP readings: (no records) per patient history, *85-88 diastolic (95 highest), ~130s systolic   Wt Readings from Last 3 Encounters:  11/07/20 246 lb (111.6 kg)  09/02/20 243 lb (110.2 kg)  08/17/20 239 lb (108.4 kg)   BP Readings from Last 3 Encounters:  11/07/20 122/84  09/02/20 (!) 140/102  08/17/20 131/88   Pulse Readings from Last 3 Encounters:  11/07/20 (!) 102  09/02/20 99  08/17/20 70    Past Medical History:  Diagnosis Date  . Hypertension   . Pure hypercholesterolemia 09/02/2020  . Vitamin D deficiency     Current Outpatient Medications on File Prior to Visit  Medication Sig Dispense Refill  . amLODipine (NORVASC) 10 MG tablet Take 1 tablet (10 mg total) by mouth daily. 90 tablet 3  . calcium-vitamin D (OSCAL WITH D) 500-200 MG-UNIT tablet Take 1 tablet by mouth.     . fluticasone (FLONASE) 50 MCG/ACT nasal spray Place 2 sprays into both nostrils daily. 16 g 6   No current facility-administered medications on file prior to visit.    No Known Allergies  Blood pressure 122/84, pulse (!) 102, resp. rate 17, height 5\' 5"  (1.651 m), weight 246 lb (111.6 kg), last menstrual period 11/03/2020, SpO2 99 %.  Essential hypertension Blood pressure at goal today, but no records from home were provided. Patient still taking metoprolol succinate 25mg  as needed when BP is "too high".   Will resume metoprolol at 12.5mg  daily, continue amlodipine 10mg , and change HCTZ to chlorthalidone 25mg  daily. Plan to repeat BMET in 2 weeks and follow up at HTN clinic in 5 weeks.   [name] PharmD, BCPS, CPP [facility] [address] [place] 01/03/2021 11/10/2020 4:39 PM

## 2020-11-10 ENCOUNTER — Encounter: Payer: Self-pay | Admitting: Pharmacist

## 2020-11-10 NOTE — Assessment & Plan Note (Signed)
Blood pressure at goal today, but no records from home were provided. Patient still taking metoprolol succinate 25mg  as needed when BP is "too high".   Will resume metoprolol at 12.5mg  daily, continue amlodipine 10mg , and change HCTZ to chlorthalidone 25mg  daily. Plan to repeat BMET in 2 weeks and follow up at HTN clinic in 5 weeks.

## 2020-11-29 ENCOUNTER — Telehealth: Payer: Self-pay

## 2020-11-29 NOTE — Telephone Encounter (Signed)
lmom to complete labs prior to appt  

## 2020-12-10 LAB — BASIC METABOLIC PANEL
BUN/Creatinine Ratio: 10 (ref 9–23)
BUN: 10 mg/dL (ref 6–24)
CO2: 25 mmol/L (ref 20–29)
Calcium: 9.8 mg/dL (ref 8.7–10.2)
Chloride: 98 mmol/L (ref 96–106)
Creatinine, Ser: 1.02 mg/dL — ABNORMAL HIGH (ref 0.57–1.00)
Glucose: 82 mg/dL (ref 65–99)
Potassium: 3.7 mmol/L (ref 3.5–5.2)
Sodium: 137 mmol/L (ref 134–144)
eGFR: 70 mL/min/{1.73_m2} (ref >59)

## 2020-12-12 ENCOUNTER — Other Ambulatory Visit: Payer: Self-pay

## 2020-12-12 ENCOUNTER — Ambulatory Visit (INDEPENDENT_AMBULATORY_CARE_PROVIDER_SITE_OTHER): Payer: BC Managed Care – PPO | Admitting: Pharmacist

## 2020-12-12 DIAGNOSIS — I10 Essential (primary) hypertension: Secondary | ICD-10-CM | POA: Diagnosis not present

## 2020-12-12 MED ORDER — METOPROLOL SUCCINATE ER 25 MG PO TB24: 25.0000 mg | ORAL_TABLET | Freq: Every day | ORAL | 1 refills | Status: DC

## 2020-12-12 NOTE — Assessment & Plan Note (Signed)
Diastolic blood pressure remains above goal. Noted systolic BP and HR improved since last OV. Patient continues to work on low sodium diet and weight management. Noted she is drinking too much Gatorade.  Will increase metoprolol succinate to 25mg  daily, continue all other medication without changes, decrease amount of Gatorade per day, increase water intake, and follow up in 6-8 weeks.

## 2020-12-12 NOTE — Progress Notes (Signed)
Patient ID: Christine Walter                 DOB: August 24, 1975                      MRN: [id number]     HPI: Christine Walter is a 45 y.o. female referred by Dr. Duke Salvia  to HTN clinic. PMH includes hypertension, morbid obesity, hx of kidney stones. Dx with hypertension in her early 30s.  Patient denies problems with current medication and reports compliance. Denies dizziness, increased fatigue, headaches or blurry vision. Referred to healthy weight an wellness. - last visit on feb/16/2022, but patient expresses interest to go back, but haven't contacted clinic for follow up appointment yet. I changed her HCTZ to chlorthalidone 25mg  during last OV. BMET repeat shows stable renal function and electrolytes.  Current HTN meds:  Amlodipine 10mg  daily - morning Chlorthalidone 25mg  - morning Metoprolol succinate 12.5mg  daily   BP goal: <130/80  Family History: family history includes Diabetes in her mother; Hypertension in her mother; Kidney disease in her maternal grandmother and paternal grandfather; Stroke in her maternal aunt and maternal uncle.  Diet: Still working on cutting back on sweets, doing less additional salts (not adding salt to cooked meals) changing from garlic salt to garlic powder. Noted she is drinking more Gatorade than water.  Exercise: 2-3 times per week, [place]3 miles [place]  Home BP readings: none provided  Wt Readings from Last 3 Encounters:  12/12/20 242 lb (109.8 kg)  11/07/20 246 lb (111.6 kg)  09/02/20 243 lb (110.2 kg)   BP Readings from Last 3 Encounters:  12/12/20 118/88  11/07/20 122/84  09/02/20 (!) 140/102   Pulse Readings from Last 3 Encounters:  12/12/20 100  11/07/20 (!) 102  09/02/20 99    Past Medical History:  Diagnosis Date   Hypertension    Pure hypercholesterolemia 09/02/2020   Vitamin D deficiency     Current Outpatient Medications on File Prior to Visit  Medication Sig Dispense Refill   amLODipine (NORVASC) 10 MG tablet Take 1  tablet (10 mg total) by mouth daily. 90 tablet 3   chlorthalidone (HYGROTON) 25 MG tablet Take 1 tablet (25 mg total) by mouth daily. 30 tablet 1   fluticasone (FLONASE) 50 MCG/ACT nasal spray Place 2 sprays into both nostrils daily. 16 g 6   prednisoLONE acetate (PRED FORTE) 1 % ophthalmic suspension 1 drop 2 (two) times daily.     No current facility-administered medications on file prior to visit.    No Known Allergies  Blood pressure 118/88, pulse 100, resp. rate 16, height 5\' 5"  (1.651 m), weight 242 lb (109.8 kg), last menstrual period 11/30/2020, SpO2 97 %.  Essential hypertension Diastolic blood pressure remains above goal. Noted systolic BP and HR improved since last OV. Patient continues to work on low sodium diet and weight management. Noted she is drinking too much Gatorade.  Will increase metoprolol succinate to 25mg  daily, continue all other medication without changes, decrease amount of Gatorade per day, increase water intake, and follow up in 6-8 weeks.   Carles Florea Rodriguez-Guzman PharmD, BCPS, CPP [facility] [address] [place] 01/30/2021 12/12/2020 10:24 AM

## 2020-12-12 NOTE — Patient Instructions (Signed)
Return for a a follow up appointment in 6-8 weeks  Check your blood pressure at home daily (if able) and keep record of the readings.  Take your BP meds as follows: *INCREASE metoprolol succinate to 25mg  (1 tablet daily)*  Bring all of your meds, your BP cuff and your record of home blood pressures to your next appointment.  Exercise as you're able, try to walk approximately 30 minutes per day.  Keep salt intake to a minimum, especially watch canned and prepared boxed foods.  Eat more fresh fruits and vegetables and fewer canned items.  Avoid eating in fast food restaurants.    HOW TO TAKE YOUR BLOOD PRESSURE: Rest 5 minutes before taking your blood pressure.  Don't smoke or drink caffeinated beverages for at least 30 minutes before. Take your blood pressure before (not after) you eat. Sit comfortably with your back supported and both feet on the floor (don't cross your legs). Elevate your arm to heart level on a table or a desk. Use the proper sized cuff. It should fit smoothly and snugly around your bare upper arm. There should be enough room to slip a fingertip under the cuff. The bottom edge of the cuff should be 1 inch above the crease of the elbow. Ideally, take 3 measurements at one sitting and record the average.

## 2021-01-25 ENCOUNTER — Other Ambulatory Visit: Payer: Self-pay | Admitting: Pharmacist

## 2021-02-01 ENCOUNTER — Ambulatory Visit: Payer: BC Managed Care – PPO

## 2021-02-01 NOTE — Progress Notes (Deleted)
Patient ID: Christine Walter                 DOB: February 20, 1976                      MRN: [id number]     HPI: Christine Walter is a 45 y.o. female referred by Dr. Duke Salvia to HTN clinic. PMH is significant for hypertension, morbid obesity, hx of kidney stones  Current HTN meds: chlorthalidone 25mg  daily, Toprol 25mg  daily, amlodipine 10mg  daily Previously tried: HCTZ BP goal: <130/80  Family History:   Social History:   Diet:   Exercise:   Home BP readings:   Wt Readings from Last 3 Encounters:  12/12/20 242 lb (109.8 kg)  11/07/20 246 lb (111.6 kg)  09/02/20 243 lb (110.2 kg)   BP Readings from Last 3 Encounters:  12/12/20 118/88  11/07/20 122/84  09/02/20 (!) 140/102   Pulse Readings from Last 3 Encounters:  12/12/20 100  11/07/20 (!) 102  09/02/20 99    Renal function: CrCl cannot be calculated (Patient's most recent lab result is older than the maximum 21 days allowed.).  Past Medical History:  Diagnosis Date   Hypertension    Pure hypercholesterolemia 09/02/2020   Vitamin D deficiency     Current Outpatient Medications on File Prior to Visit  Medication Sig Dispense Refill   amLODipine (NORVASC) 10 MG tablet Take 1 tablet (10 mg total) by mouth daily. 90 tablet 3   chlorthalidone (HYGROTON) 25 MG tablet TAKE 1 TABLET BY MOUTH ONCE DAILY (DISCONTINUE  HYDROCHLOROTHIAZIDE) 90 tablet 2   fluticasone (FLONASE) 50 MCG/ACT nasal spray Place 2 sprays into both nostrils daily. 16 g 6   metoprolol succinate (TOPROL-XL) 25 MG 24 hr tablet Take 1 tablet (25 mg total) by mouth daily. 90 tablet 1   prednisoLONE acetate (PRED FORTE) 1 % ophthalmic suspension 1 drop 2 (two) times daily.     No current facility-administered medications on file prior to visit.    No Known Allergies   Assessment/Plan:  1. Hypertension -

## 2021-02-02 ENCOUNTER — Telehealth: Payer: Self-pay

## 2021-02-02 NOTE — Telephone Encounter (Signed)
LMOM TO R/S MISSED APPT  

## 2021-07-24 ENCOUNTER — Emergency Department (HOSPITAL_COMMUNITY)
Admission: EM | Admit: 2021-07-24 | Discharge: 2021-07-25 | Discharge status: Against Medical Advice | Payer: BC Managed Care – PPO | Attending: Emergency Medicine | Admitting: Emergency Medicine

## 2021-07-24 ENCOUNTER — Emergency Department (HOSPITAL_COMMUNITY): Payer: BC Managed Care – PPO

## 2021-07-24 ENCOUNTER — Encounter (HOSPITAL_COMMUNITY): Payer: Self-pay

## 2021-07-24 DIAGNOSIS — Z5321 Procedure and treatment not carried out due to patient leaving prior to being seen by health care provider: Secondary | ICD-10-CM | POA: Diagnosis not present

## 2021-07-24 DIAGNOSIS — R0789 Other chest pain: Secondary | ICD-10-CM | POA: Diagnosis present

## 2021-07-24 DIAGNOSIS — R0602 Shortness of breath: Secondary | ICD-10-CM | POA: Diagnosis not present

## 2021-07-24 NOTE — ED Provider Triage Note (Signed)
Emergency Medicine Provider Triage Evaluation Note  Christine Walter , a 46 y.o. female  was evaluated in triage.  Pt complains of chest pressure, shortness of breath.  Began earlier today.  Pain is constant.  His heart rate to back, left arm, jaw.  No cough.  No PND, orthopnea, history of PE, DVT, lower extremity swelling, pain.  Review of Systems  Positive: Chest pain, shortness of breath Negative: Fever, cough, lower extremity pain, swelling  Physical Exam  BP (!) 161/94 (BP Location: Right Arm)    Pulse 98    Temp 98 F (36.7 C)    Resp 18    SpO2 100%  Gen:   Awake, no distress   Resp:  Normal effort  MSK:   Moves extremities without difficulty  Other:    Medical Decision Making  Medically screening exam initiated at 11:22 PM.  Appropriate orders placed.  Christine Walter was informed that the remainder of the evaluation will be completed by another provider, this initial triage assessment does not replace that evaluation, and the importance of remaining in the ED until their evaluation is complete.  CP, SOB   Ellise Kovack A, PA-C 07/24/21 2323

## 2021-07-24 NOTE — ED Triage Notes (Signed)
Patient arrives POV c/o SOB and pressure across top of chest beginning at 5pm. Feeling has remained constant since starting.  Patient has not had anything like this before. Patient has hx HTN.

## 2021-07-25 LAB — CBC WITH DIFFERENTIAL/PLATELET
Abs Immature Granulocytes: 0.02 10*3/uL (ref 0.00–0.07)
Basophils Absolute: 0 10*3/uL (ref 0.0–0.1)
Basophils Relative: 0 %
Eosinophils Absolute: 0.1 10*3/uL (ref 0.0–0.5)
Eosinophils Relative: 1 %
HCT: 41.6 % (ref 36.0–46.0)
Hemoglobin: 13.2 g/dL (ref 12.0–15.0)
Immature Granulocytes: 0 %
Lymphocytes Relative: 38 %
Lymphs Abs: 3.3 10*3/uL (ref 0.7–4.0)
MCH: 26.8 pg (ref 26.0–34.0)
MCHC: 31.7 g/dL (ref 30.0–36.0)
MCV: 84.6 fL (ref 80.0–100.0)
Monocytes Absolute: 0.8 10*3/uL (ref 0.1–1.0)
Monocytes Relative: 10 %
Neutro Abs: 4.3 10*3/uL (ref 1.7–7.7)
Neutrophils Relative %: 51 %
Platelets: 256 10*3/uL (ref 150–400)
RBC: 4.92 MIL/uL (ref 3.87–5.11)
RDW: 13.4 % (ref 11.5–15.5)
WBC: 8.5 10*3/uL (ref 4.0–10.5)
nRBC: 0 % (ref 0.0–0.2)

## 2021-07-25 LAB — BASIC METABOLIC PANEL
Anion gap: 14 (ref 5–15)
BUN: 10 mg/dL (ref 6–20)
CO2: 24 mmol/L (ref 22–32)
Calcium: 9.4 mg/dL (ref 8.9–10.3)
Chloride: 101 mmol/L (ref 98–111)
Creatinine, Ser: 1.08 mg/dL — ABNORMAL HIGH (ref 0.44–1.00)
GFR, Estimated: >60 mL/min (ref >60)
Glucose, Bld: 112 mg/dL — ABNORMAL HIGH (ref 70–99)
Potassium: 3.1 mmol/L — ABNORMAL LOW (ref 3.5–5.1)
Sodium: 139 mmol/L (ref 135–145)

## 2021-07-25 LAB — TROPONIN I (HIGH SENSITIVITY)
Troponin I (High Sensitivity): 3 ng/L (ref <18)
Troponin I (High Sensitivity): 4 ng/L (ref <18)

## 2021-07-25 LAB — I-STAT BETA HCG BLOOD, ED (MC, WL, AP ONLY): I-stat hCG, quantitative: <5 m[IU]/mL (ref <5)

## 2021-07-25 NOTE — ED Notes (Signed)
Pt called for vitals recheck. No response. °

## 2021-08-09 ENCOUNTER — Other Ambulatory Visit: Payer: Self-pay | Admitting: Radiology

## 2021-10-15 ENCOUNTER — Other Ambulatory Visit: Payer: Self-pay | Admitting: Cardiovascular Disease

## 2021-10-15 DIAGNOSIS — I10 Essential (primary) hypertension: Secondary | ICD-10-CM

## 2021-10-16 NOTE — Telephone Encounter (Signed)
Rx(s) sent to pharmacy electronically.  

## 2021-10-28 ENCOUNTER — Other Ambulatory Visit: Payer: Self-pay | Admitting: Cardiovascular Disease

## 2021-10-30 NOTE — Telephone Encounter (Signed)
Rx(s) sent to pharmacy electronically.  

## 2021-11-15 ENCOUNTER — Other Ambulatory Visit: Payer: Self-pay | Admitting: Cardiovascular Disease

## 2021-11-15 DIAGNOSIS — I10 Essential (primary) hypertension: Secondary | ICD-10-CM

## 2021-11-15 NOTE — Telephone Encounter (Signed)
Rx(s) sent to pharmacy electronically.  

## 2021-11-29 ENCOUNTER — Other Ambulatory Visit: Payer: Self-pay | Admitting: Cardiovascular Disease

## 2021-11-29 DIAGNOSIS — I10 Essential (primary) hypertension: Secondary | ICD-10-CM

## 2021-12-01 ENCOUNTER — Other Ambulatory Visit: Payer: Self-pay | Admitting: Cardiovascular Disease

## 2021-12-01 ENCOUNTER — Telehealth (HOSPITAL_BASED_OUTPATIENT_CLINIC_OR_DEPARTMENT_OTHER): Payer: Self-pay | Admitting: Cardiovascular Disease

## 2021-12-01 DIAGNOSIS — I10 Essential (primary) hypertension: Secondary | ICD-10-CM

## 2021-12-01 MED ORDER — CHLORTHALIDONE 25 MG PO TABS: 25.0000 mg | ORAL_TABLET | Freq: Every day | ORAL | 0 refills | Status: DC

## 2021-12-01 MED ORDER — AMLODIPINE BESYLATE 10 MG PO TABS: 10.0000 mg | ORAL_TABLET | Freq: Every day | ORAL | 0 refills | Status: DC

## 2021-12-01 NOTE — Telephone Encounter (Signed)
Rx(s) sent to pharmacy electronically.  

## 2021-12-01 NOTE — Telephone Encounter (Signed)
*  STAT* If patient is at the pharmacy, call can be transferred to refill team.   1. Which medications need to be refilled? (please list name of each medication and dose if known)  amLODipine (NORVASC) 10 MG tablet chlorthalidone (HYGROTON) 25 MG tablet  2. Which pharmacy/location (including street and city if local pharmacy) is medication to be sent to? [facility] [address], [place] - [address]  3. Do they need a 30 day or 90 day supply? 30 days  Patient is scheduled to see Gillian Shields 12/18/21

## 2021-12-18 ENCOUNTER — Encounter (HOSPITAL_BASED_OUTPATIENT_CLINIC_OR_DEPARTMENT_OTHER): Payer: Self-pay | Admitting: Family

## 2021-12-18 ENCOUNTER — Ambulatory Visit (INDEPENDENT_AMBULATORY_CARE_PROVIDER_SITE_OTHER): Payer: BC Managed Care – PPO | Admitting: Family

## 2021-12-18 VITALS — BP 120/80 | HR 79 | Ht 65.0 in | Wt 245.0 lb

## 2021-12-18 DIAGNOSIS — E782 Mixed hyperlipidemia: Secondary | ICD-10-CM

## 2021-12-18 DIAGNOSIS — Z6841 Body Mass Index (BMI) 40.0 and over, adult: Secondary | ICD-10-CM

## 2021-12-18 DIAGNOSIS — I10 Essential (primary) hypertension: Secondary | ICD-10-CM | POA: Diagnosis not present

## 2021-12-18 MED ORDER — AMLODIPINE BESYLATE 10 MG PO TABS: 10.0000 mg | ORAL_TABLET | Freq: Every day | ORAL | 3 refills | Status: DC

## 2021-12-18 MED ORDER — CHLORTHALIDONE 25 MG PO TABS: 25.0000 mg | ORAL_TABLET | Freq: Every day | ORAL | 3 refills | Status: DC

## 2021-12-18 NOTE — Patient Instructions (Signed)
Medication Instructions:  Continue your current medications.   *If you need a refill on your cardiac medications before your next appointment, please call your pharmacy*   Lab Work: None ordered today.    Testing/Procedures: None ordered today.   Please send [place] a message if you decide you would like to complete sleep study.   Follow-Up: At [facility], you and your health needs are our priority.  As part of our continuing mission to provide you with exceptional heart care, we have created designated Provider Care Teams.  These Care Teams include your primary Cardiologist (physician) and Advanced Practice Providers (APPs -  Physician Assistants and Nurse Practitioners) who all work together to provide you with the care you need, when you need it.  We recommend signing up for the patient portal called "MyChart".  Sign up information is provided on this After Visit Summary.  MyChart is used to connect with patients for Virtual Visits (Telemedicine).  Patients are able to view lab/test results, encounter notes, upcoming appointments, etc.  Non-urgent messages can be sent to your provider as well.   To learn more about what you can do with MyChart, go to [email or web address].    Your next appointment:   1 year(s)  The format for your next appointment:   In Person  Provider:   Chilton Si, MD or Alver Sorrow, NP    Other Instructions  Heart Healthy Diet Recommendations: A low-salt diet is recommended. Meats should be grilled, baked, or boiled. Avoid fried foods. Focus on lean protein sources like fish or chicken with vegetables and fruits. The American Heart Association is a Chief Technology Officer!  American Heart Association Diet and Lifeystyle Recommendations   Exercise recommendations: The American Heart Association recommends 150 minutes of moderate intensity exercise weekly. Try 30 minutes of moderate intensity exercise 4-5 times per week. This could include walking,  jogging, or swimming. Send [place] a MyChart message if you would like to participate in PREP exercise program at the [facility].   Important Information About Sugar

## 2021-12-18 NOTE — Progress Notes (Signed)
Office Visit    Patient Name: Christine Walter Date of Encounter: 12/18/2021  PCP:  [name], DO   [facility]  Cardiologist:  Chilton Si, MD  Advanced Practice Provider:  No care team member to display Electrophysiologist:  None      Chief Complaint    Christine Walter is a 46 y.o. female presents today for hypertension follow-up   Past Medical History    Past Medical History:  Diagnosis Date   Hypertension    Pure hypercholesterolemia 09/02/2020   Vitamin D deficiency    Past Surgical History:  Procedure Laterality Date   CHOLECYSTECTOMY     DILATION AND EVACUATION     kidney stent      Allergies  No Known Allergies  History of Present Illness    Christine Walter is a 46 y.o. female with a hx of hypertension, morbid obesity, kidney stones last seen 11/2020 by pharmacy team.  Diagnosed hypertension in her early 30s.  She does get headache with elevated blood pressures 130s-140s.  Hydrochlorothiazide previously transitioned to chlorthalidone.  Renal artery duplex previously ordered but not completed. At last clinic visit 613/22 metoprolol succinate increased to 25 mg daily.  She was recommended to reduce Gatorade and increase water intake.  Recommended for follow-up in 6 to 8 weeks.  She presents today for overdue follow-up.  Pleasant lady who lives at home with her husband and 69 and 54 year old sons.  Her 32 year old son is preparing to go off to college at [employer].  Since last seen she has felt well.  Checking blood pressure very intermittently at home.  Has been taking chlorthalidone and amlodipine but not metoprolol. Reports no shortness of breath nor dyspnea on exertion. Reports no chest pain, pressure, or tightness. No orthopnea, PND.  Does note pedal edema at the end of the day that resolves by morning.  She does sit most of the day at her job at [employer].  Reports no palpitations.  She does note that she snores at night.   Her husband has not witnessed her stop breathing.  She sometimes wakes feeling well rested and other times not.  No prior sleep study.  We discussed possible sleep study and she wishes to think about it.  [place]-we discussed possible referral to prep but she also prefers to think about.   Previous antihypertensives: HCTZ (transition to chlorthalidone) Losartan Metoprolol succinate   EKGs/Labs/Other Studies Reviewed:   The following studies were reviewed today:   EKG: No EKG today  Recent Labs: 07/24/2021: BUN 10; Creatinine, Ser 1.08; Hemoglobin 13.2; Platelets 256; Potassium 3.1; Sodium 139  Recent Lipid Panel    Component Value Date/Time   CHOL 208 (H) 05/06/2020 0951   TRIG 85 05/06/2020 0951   HDL 50 05/06/2020 0951   CHOLHDL 4.2 05/06/2020 0951   VLDL 16.0 09/05/2018 0807   LDLCALC 140 (H) 05/06/2020 0951     Home Medications   Current Meds  Medication Sig   amLODipine (NORVASC) 10 MG tablet Take 1 tablet (10 mg total) by mouth daily. KEEP APPOINTMENT   chlorthalidone (HYGROTON) 25 MG tablet Take 1 tablet (25 mg total) by mouth daily. KEEP APPOINTMENT   metoprolol succinate (TOPROL-XL) 25 MG 24 hr tablet Take 1 tablet (25 mg total) by mouth daily.     Review of Systems      All other systems reviewed and are otherwise negative except as noted  above.  Physical Exam    VS:  BP 120/80 (BP Location: Left Arm, Patient Position: Sitting, Cuff Size: Normal)   Pulse 79   Ht 5\' 5"  (1.651 m)   Wt 245 lb (111.1 kg)   SpO2 96%   BMI 40.77 kg/m  , BMI Body mass index is 40.77 kg/m.  Wt Readings from Last 3 Encounters:  12/18/21 245 lb (111.1 kg)  12/12/20 242 lb (109.8 kg)  11/07/20 246 lb (111.6 kg)     GEN: Well nourished, overweight, well developed, in no acute distress. HEENT: normal. Neck: Supple, no JVD, carotid bruits, or masses. Cardiac: RRR, no murmurs, rubs, or gallops. No clubbing, cyanosis, edema.  Radials/PT 2+ and  equal bilaterally.  Respiratory:  Respirations regular and unlabored, clear to auscultation bilaterally. GI: Soft, nontender, nondistended. MS: No deformity or atrophy. Skin: Warm and dry, no rash. Neuro:  Strength and sensation are intact. Psych: Normal affect.  Assessment & Plan    Hypertension -BP well controlled on amlodipine 10 mg daily and chlorthalidone 25 mg daily.  Not presently taking metoprolol succinate and will remove from her medication list as blood pressure well controlled. Heart healthy diet and regular cardiovascular exercise encouraged.    Hyperlipidemia - Lipid lowering diet encouraged. Due for updated lipid panel. Plans to schedule annual visit with her PCP.   LE edema -mild lower extremity edema at end of day that resolves by morning.  Likely etiology venous insufficiency.  Discussed recommendations including compression socks, elevating legs, low-sodium diet.  No dyspnea nor orthopnea suggestive of heart failure.  No indication for echocardiogram.  Obesity - Weight loss via diet and exercise encouraged. Discussed the impact being overweight would have on cardiovascular risk.  [place].  Offered referral to prep exercise program and she prefers to think about it.  Disposition: Follow up in 1 year(s) with 01/07/21, MD or APP.  Signed, Chilton Si, NP 12/18/2021, 10:20 AM [facility]

## 2022-02-07 ENCOUNTER — Encounter (INDEPENDENT_AMBULATORY_CARE_PROVIDER_SITE_OTHER): Payer: Self-pay

## 2023-01-07 ENCOUNTER — Other Ambulatory Visit (HOSPITAL_BASED_OUTPATIENT_CLINIC_OR_DEPARTMENT_OTHER): Payer: Self-pay | Admitting: Family

## 2023-01-07 DIAGNOSIS — I10 Essential (primary) hypertension: Secondary | ICD-10-CM

## 2023-01-07 NOTE — Telephone Encounter (Signed)
Please call pt to schedule overdue 1 year follow-up appointment with Dr. Viera East or APP for refills. Thank you! 

## 2023-01-08 NOTE — Telephone Encounter (Signed)
Left message for patient to call and schedule overdue follow up with Dr. Duke Salvia /app for medication refills

## 2023-01-09 ENCOUNTER — Telehealth: Payer: Self-pay | Admitting: Family

## 2023-01-09 DIAGNOSIS — I10 Essential (primary) hypertension: Secondary | ICD-10-CM

## 2023-01-09 MED ORDER — CHLORTHALIDONE 25 MG PO TABS: 25.0000 mg | ORAL_TABLET | Freq: Every day | ORAL | 0 refills | Status: DC

## 2023-01-09 MED ORDER — AMLODIPINE BESYLATE 10 MG PO TABS: 10.0000 mg | ORAL_TABLET | Freq: Every day | ORAL | 0 refills | Status: DC

## 2023-01-09 NOTE — Telephone Encounter (Signed)
*  STAT* If patient is at the pharmacy, call can be transferred to refill team.   1. Which medications need to be refilled? (please list name of each medication and dose if known)    amLODipine (NORVASC) 10 MG tablet Take 1 tablet (10 mg total) by mouth daily.   chlorthalidone (HYGROTON) 25 MG tablet Take 1 tablet (25 mg total) by mouth daily.     2. Which pharmacy/location (including street and city if local pharmacy) is medication to be sent to?    [facility] - [place], [place] - [address]    3. Do they need a 30 day or 90 day supply? 90

## 2023-01-09 NOTE — Telephone Encounter (Signed)
Rx request sent to pharmacy. Pt scheduled to see Gillian Shields, NP this month.

## 2023-01-11 NOTE — Telephone Encounter (Signed)
Scheduled with Gillian Shields, NP on 01/22/23

## 2023-01-22 ENCOUNTER — Ambulatory Visit (HOSPITAL_BASED_OUTPATIENT_CLINIC_OR_DEPARTMENT_OTHER): Payer: BC Managed Care – PPO | Admitting: Family

## 2023-04-15 ENCOUNTER — Other Ambulatory Visit: Payer: Self-pay | Admitting: Cardiovascular Disease

## 2023-04-15 DIAGNOSIS — I10 Essential (primary) hypertension: Secondary | ICD-10-CM

## 2023-04-17 MED ORDER — CHLORTHALIDONE 25 MG PO TABS: 25.0000 mg | ORAL_TABLET | Freq: Every day | ORAL | 0 refills | Status: DC

## 2023-04-17 MED ORDER — AMLODIPINE BESYLATE 10 MG PO TABS: 10.0000 mg | ORAL_TABLET | Freq: Every day | ORAL | 0 refills | Status: DC

## 2023-05-07 ENCOUNTER — Encounter: Payer: BC Managed Care – PPO | Admitting: Family Medicine

## 2023-05-28 ENCOUNTER — Encounter: Payer: Self-pay | Admitting: Family Medicine

## 2023-05-28 ENCOUNTER — Ambulatory Visit (INDEPENDENT_AMBULATORY_CARE_PROVIDER_SITE_OTHER): Payer: BC Managed Care – PPO | Admitting: Family Medicine

## 2023-05-28 VITALS — BP 130/84 | HR 103 | Temp 98.0°F | Resp 18 | Ht 65.0 in | Wt 251.2 lb

## 2023-05-28 DIAGNOSIS — E7800 Pure hypercholesterolemia, unspecified: Secondary | ICD-10-CM

## 2023-05-28 DIAGNOSIS — Z1211 Encounter for screening for malignant neoplasm of colon: Secondary | ICD-10-CM

## 2023-05-28 DIAGNOSIS — Z1159 Encounter for screening for other viral diseases: Secondary | ICD-10-CM

## 2023-05-28 DIAGNOSIS — Z23 Encounter for immunization: Secondary | ICD-10-CM | POA: Diagnosis not present

## 2023-05-28 DIAGNOSIS — Z0000 Encounter for general adult medical examination without abnormal findings: Secondary | ICD-10-CM

## 2023-05-28 DIAGNOSIS — I10 Essential (primary) hypertension: Secondary | ICD-10-CM

## 2023-05-28 LAB — LIPID PANEL
Cholesterol: 233 mg/dL — ABNORMAL HIGH (ref 0–200)
HDL: 49.4 mg/dL (ref >39.00)
LDL Cholesterol: 161 mg/dL — ABNORMAL HIGH (ref 0–99)
NonHDL: 183.79
Total CHOL/HDL Ratio: 5
Triglycerides: 115 mg/dL (ref 0.0–149.0)
VLDL: 23 mg/dL (ref 0.0–40.0)

## 2023-05-28 LAB — CBC WITH DIFFERENTIAL/PLATELET
Basophils Absolute: 0 10*3/uL (ref 0.0–0.1)
Basophils Relative: 0.6 % (ref 0.0–3.0)
Eosinophils Absolute: 0.1 10*3/uL (ref 0.0–0.7)
Eosinophils Relative: 1.1 % (ref 0.0–5.0)
HCT: 40.4 % (ref 36.0–46.0)
Hemoglobin: 13 g/dL (ref 12.0–15.0)
Lymphocytes Relative: 26.3 % (ref 12.0–46.0)
Lymphs Abs: 1.7 10*3/uL (ref 0.7–4.0)
MCHC: 32.1 g/dL (ref 30.0–36.0)
MCV: 86.6 fL (ref 78.0–100.0)
Monocytes Absolute: 0.8 10*3/uL (ref 0.1–1.0)
Monocytes Relative: 12.5 % — ABNORMAL HIGH (ref 3.0–12.0)
Neutro Abs: 3.8 10*3/uL (ref 1.4–7.7)
Neutrophils Relative %: 59.5 % (ref 43.0–77.0)
Platelets: 229 10*3/uL (ref 150.0–400.0)
RBC: 4.67 Mil/uL (ref 3.87–5.11)
RDW: 14.1 % (ref 11.5–15.5)
WBC: 6.4 10*3/uL (ref 4.0–10.5)

## 2023-05-28 LAB — COMPREHENSIVE METABOLIC PANEL
ALT: 14 U/L (ref 0–35)
AST: 14 U/L (ref 0–37)
Albumin: 4 g/dL (ref 3.5–5.2)
Alkaline Phosphatase: 96 U/L (ref 39–117)
BUN: 11 mg/dL (ref 6–23)
CO2: 29 meq/L (ref 19–32)
Calcium: 9.2 mg/dL (ref 8.4–10.5)
Chloride: 103 meq/L (ref 96–112)
Creatinine, Ser: 0.87 mg/dL (ref 0.40–1.20)
GFR: 80.62 mL/min (ref >60.00)
Glucose, Bld: 86 mg/dL (ref 70–99)
Potassium: 3.9 meq/L (ref 3.5–5.1)
Sodium: 138 meq/L (ref 135–145)
Total Bilirubin: 0.5 mg/dL (ref 0.2–1.2)
Total Protein: 6.9 g/dL (ref 6.0–8.3)

## 2023-05-28 LAB — TSH: TSH: 1.32 u[IU]/mL (ref 0.35–5.50)

## 2023-05-28 MED ORDER — CHLORTHALIDONE 25 MG PO TABS: 25.0000 mg | ORAL_TABLET | Freq: Every day | ORAL | 1 refills | Status: DC

## 2023-05-28 MED ORDER — AMLODIPINE BESYLATE 10 MG PO TABS: 10.0000 mg | ORAL_TABLET | Freq: Every day | ORAL | 1 refills | Status: DC

## 2023-05-28 NOTE — Patient Instructions (Signed)
Preventive Care 40-45 Years Old, Female Preventive care refers to lifestyle choices and visits with your health care provider that can promote health and wellness. Preventive care visits are also called wellness exams. What can I expect for my preventive care visit? Counseling Your health care provider may ask you questions about your: Medical history, including: Past medical problems. Family medical history. Pregnancy history. Current health, including: Menstrual cycle. Method of birth control. Emotional well-being. Home life and relationship well-being. Sexual activity and sexual health. Lifestyle, including: Alcohol, nicotine or tobacco, and drug use. Access to firearms. Diet, exercise, and sleep habits. Work and work environment. Sunscreen use. Safety issues such as seatbelt and bike helmet use. Physical exam Your health care provider will check your: Height and weight. These may be used to calculate your BMI (body mass index). BMI is a measurement that tells if you are at a healthy weight. Waist circumference. This measures the distance around your waistline. This measurement also tells if you are at a healthy weight and may help predict your risk of certain diseases, such as type 2 diabetes and high blood pressure. Heart rate and blood pressure. Body temperature. Skin for abnormal spots. What immunizations do I need?  Vaccines are usually given at various ages, according to a schedule. Your health care provider will recommend vaccines for you based on your age, medical history, and lifestyle or other factors, such as travel or where you work. What tests do I need? Screening Your health care provider may recommend screening tests for certain conditions. This may include: Lipid and cholesterol levels. Diabetes screening. This is done by checking your blood sugar (glucose) after you have not eaten for a while (fasting). Pelvic exam and Pap test. Hepatitis B test. Hepatitis C  test. HIV (human immunodeficiency virus) test. STI (sexually transmitted infection) testing, if you are at risk. Lung cancer screening. Colorectal cancer screening. Mammogram. Talk with your health care provider about when you should start having regular mammograms. This may depend on whether you have a family history of breast cancer. BRCA-related cancer screening. This may be done if you have a family history of breast, ovarian, tubal, or peritoneal cancers. Bone density scan. This is done to screen for osteoporosis. Talk with your health care provider about your test results, treatment options, and if necessary, the need for more tests. Follow these instructions at home: Eating and drinking  Eat a diet that includes fresh fruits and vegetables, whole grains, lean protein, and low-fat dairy products. Take vitamin and mineral supplements as recommended by your health care provider. Do not drink alcohol if: Your health care provider tells you not to drink. You are pregnant, may be pregnant, or are planning to become pregnant. If you drink alcohol: Limit how much you have to 0-1 drink a day. Know how much alcohol is in your drink. In the [place] one drink equals one 12 oz bottle of beer (355 mL), one 5 oz glass of wine (148 mL), or one 1 oz glass of hard liquor (44 mL). Lifestyle Brush your teeth every morning and night with fluoride toothpaste. Floss one time each day. Exercise for at least 30 minutes 5 or more days each week. Do not use any products that contain nicotine or tobacco. These products include cigarettes, chewing tobacco, and vaping devices, such as e-cigarettes. If you need help quitting, ask your health care provider. Do not use drugs. If you are sexually active, practice safe sex. Use a condom or other form of protection to   prevent STIs. If you do not wish to become pregnant, use a form of birth control. If you plan to become pregnant, see your health care provider for a  prepregnancy visit. Take aspirin only as told by your health care provider. Make sure that you understand how much to take and what form to take. Work with your health care provider to find out whether it is safe and beneficial for you to take aspirin daily. Find healthy ways to manage stress, such as: Meditation, yoga, or listening to music. Journaling. Talking to a trusted person. Spending time with friends and family. Minimize exposure to UV radiation to reduce your risk of skin cancer. Safety Always wear your seat belt while driving or riding in a vehicle. Do not drive: If you have been drinking alcohol. Do not ride with someone who has been drinking. When you are tired or distracted. While texting. If you have been using any mind-altering substances or drugs. Wear a helmet and other protective equipment during sports activities. If you have firearms in your house, make sure you follow all gun safety procedures. Seek help if you have been physically or sexually abused. What's next? Visit your health care provider once a year for an annual wellness visit. Ask your health care provider how often you should have your eyes and teeth checked. Stay up to date on all vaccines. This information is not intended to replace advice given to you by your health care provider. Make sure you discuss any questions you have with your health care provider. Document Revised: 12/14/2020 Document Reviewed: 12/14/2020 Elsevier Patient Education  2024 [employer].  

## 2023-05-28 NOTE — Progress Notes (Signed)
Established Patient Office Visit  Subjective   Patient ID: Christine Walter, female    DOB: April 24, 1976  Age: 47 y.o. MRN: [id number]  Chief Complaint  Patient presents with   Annual Exam    Concerns/ questions: none Hep C screen due Pap: Dr Cherly Hensen Td: none Flu shot today: declines  Colonoscopy: none yet    HPI. Discussed the use of AI scribe software for clinical note transcription with the patient, who gave verbal consent to proceed.  History of Present Illness   The patient, a 47 year old with a history of hypertension, presents for a routine physical examination. She reports no new health complaints and has been managing her hypertension with prescribed medication. However, she expresses dissatisfaction with a recent referral to a cardiology program for blood pressure management, citing lack of improvement and high out-of-pocket costs. She has decided to discontinue participation in the program and return to her primary care provider for hypertension management.  The patient also mentions a known hernia, which has been asymptomatic and thus not treated.  Lastly, the patient reports the presence of skin tags, which occasionally become irritated due to friction but have not been bothersome enough to warrant removal.      Patient Active Problem List   Diagnosis Date Noted   Pure hypercholesterolemia 09/02/2020   Morbid obesity (HCC) 05/06/2020   Menstrual migraine without status migrainosus, not intractable 09/01/2018   Disorder of bone and cartilage 05/27/2007   Essential hypertension 01/16/2007   Past Medical History:  Diagnosis Date   Hypertension    Pure hypercholesterolemia 09/02/2020   Vitamin D deficiency    Past Surgical History:  Procedure Laterality Date   CHOLECYSTECTOMY     DILATION AND EVACUATION     kidney stent     Social History   Tobacco Use   Smoking status: Never   Smokeless tobacco: Never  Substance Use Topics   Alcohol use: No     Alcohol/week: 0.0 standard drinks of alcohol    Comment: rare   Drug use: No   Social History   Socioeconomic History   Marital status: Married    Spouse name: Not on file   Number of children: Not on file   Years of education: Not on file   Highest education level: Not on file  Occupational History   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[employer]    Comment: Business Service  Tobacco Use   Smoking status: Never   Smokeless tobacco: Never  Substance and Sexual Activity   Alcohol use: No    Alcohol/week: 0.0 standard drinks of alcohol    Comment: rare   Drug use: No   Sexual activity: Yes    Partners: Male  Other Topics Concern   Not on file  Social History Narrative   Exercise --- no-- but a walking challenge has started at work   Social Determinants of Corporate investment banker Strain: Not on file  Food Insecurity: Not on file  Transportation Needs: Not on file  Physical Activity: Insufficiently Active (09/02/2020)   Exercise Vital Sign    Days of Exercise per Week: 3 days    Minutes of Exercise per Session: 30 min  Stress: Not on file  Social Connections: Not on file  Intimate Partner Violence: Not on file   Family Status  Relation Name Status   Mother  Alive   Father  Alive   PGF  (Not Specified)   Mat Aunt  (Not Specified)  Mat Uncle  (Not Specified)   MGM  (Not Specified)  No partnership data on file   Family History  Problem Relation Age of Onset   Diabetes Mother    Hypertension Mother    Kidney disease Paternal Grandfather    Stroke Maternal Aunt    Stroke Maternal Uncle    Kidney disease Maternal Grandmother    No Known Allergies    Review of Systems  Constitutional:  Negative for chills, fever and malaise/fatigue.  HENT:  Negative for congestion and hearing loss.   Eyes:  Negative for blurred vision and discharge.  Respiratory:  Negative for cough, sputum production and shortness of breath.   Cardiovascular:  Negative for  chest pain, palpitations and leg swelling.  Gastrointestinal:  Negative for abdominal pain, blood in stool, constipation, diarrhea, heartburn, nausea and vomiting.  Genitourinary:  Negative for dysuria, frequency, hematuria and urgency.  Musculoskeletal:  Negative for back pain, falls and myalgias.  Skin:  Negative for rash.  Neurological:  Negative for dizziness, sensory change, loss of consciousness, weakness and headaches.  Endo/Heme/Allergies:  Negative for environmental allergies. Does not bruise/bleed easily.  Psychiatric/Behavioral:  Negative for depression and suicidal ideas. The patient is not nervous/anxious and does not have insomnia.       Objective:     BP 130/84 (BP Location: Left Arm, Patient Position: Sitting, Cuff Size: Large)   Pulse (!) 103   Temp 98 F (36.7 C) (Temporal)   Resp 18   Ht 5\' 5"  (1.651 m)   Wt 251 lb 3.2 oz (113.9 kg)   SpO2 97%   BMI 41.80 kg/m  BP Readings from Last 3 Encounters:  05/28/23 130/84  12/18/21 120/80  07/25/21 (!) 136/107   Wt Readings from Last 3 Encounters:  05/28/23 251 lb 3.2 oz (113.9 kg)  12/18/21 245 lb (111.1 kg)  12/12/20 242 lb (109.8 kg)   SpO2 Readings from Last 3 Encounters:  05/28/23 97%  12/18/21 96%  07/25/21 99%      Physical Exam Vitals and nursing note reviewed.      No results found for any visits on 05/28/23.  Last CBC Lab Results  Component Value Date   WBC 8.5 07/24/2021   HGB 13.2 07/24/2021   HCT 41.6 07/24/2021   MCV 84.6 07/24/2021   MCH 26.8 07/24/2021   RDW 13.4 07/24/2021   PLT 256 07/24/2021   Last metabolic panel Lab Results  Component Value Date   GLUCOSE 112 (H) 07/24/2021   NA 139 07/24/2021   K 3.1 (L) 07/24/2021   CL 101 07/24/2021   CO2 24 07/24/2021   BUN 10 07/24/2021   CREATININE 1.08 (H) 07/24/2021   GFRNONAA >60 07/24/2021   CALCIUM 9.4 07/24/2021   PROT 7.1 08/03/2020   ALBUMIN 4.2 08/03/2020   LABGLOB 2.9 08/03/2020   AGRATIO 1.4 08/03/2020    BILITOT 0.6 08/03/2020   ALKPHOS 111 08/03/2020   AST 14 08/03/2020   ALT 20 08/03/2020   ANIONGAP 14 07/24/2021   Last lipids Lab Results  Component Value Date   CHOL 208 (H) 05/06/2020   HDL 50 05/06/2020   LDLCALC 140 (H) 05/06/2020   TRIG 85 05/06/2020   CHOLHDL 4.2 05/06/2020   Last hemoglobin A1c Lab Results  Component Value Date   HGBA1C 5.3 05/06/2020   Last thyroid functions Lab Results  Component Value Date   TSH 1.390 08/03/2020   T3TOTAL 130 08/03/2020   T4TOTAL 7.3 08/03/2020   Last vitamin D Lab Results  Component Value Date   VD25OH 18 (L) 05/06/2020   Last vitamin B12 and Folate Lab Results  Component Value Date   FOLATE 6.4 08/03/2020      The ASCVD Risk score (Arnett DK, et al., 2019) failed to calculate for the following reasons:   Cannot find a previous HDL lab   Cannot find a previous total cholesterol lab    Assessment & Plan:   Problem List Items Addressed This Visit       Unprioritized   Pure hypercholesterolemia   Relevant Medications   amLODipine (NORVASC) 10 MG tablet   chlorthalidone (HYGROTON) 25 MG tablet   Other Relevant Orders   Comprehensive metabolic panel   Lipid panel   Essential hypertension   Relevant Medications   amLODipine (NORVASC) 10 MG tablet   chlorthalidone (HYGROTON) 25 MG tablet   Other Relevant Orders   CBC with Differential/Platelet   Comprehensive metabolic panel   Lipid panel   Other Visit Diagnoses     Preventative health care    -  Primary   Relevant Orders   CBC with Differential/Platelet   Comprehensive metabolic panel   Lipid panel   TSH   Colon cancer screening       Relevant Orders   Ambulatory referral to Gastroenterology   Need for influenza vaccination       Relevant Orders   Flu vaccine trivalent PF, 6mos and older(Flulaval,Afluria,Fluarix,Fluzone) (Completed)   Need for pneumococcal 20-valent conjugate vaccination       Need for Tdap vaccination       Relevant Orders    Tdap vaccine greater than or equal to 7yo IM (Completed)   Need for hepatitis C screening test       Relevant Orders   Hepatitis C antibody     Assessment and Plan    Hypertension   Hypertension is managed through primary care, with previous cardiology referrals proving not beneficial and costly. Her blood pressure is acceptable today. We will refill hypertension medications for 90 days at [facility].  Hernia   She has an asymptomatic hernia and has been advised to seek medical attention if it becomes painful or enlarges, noting it is an emergency if painful and non-reducible. We will monitor the hernia for changes in size or symptoms.  Skin Tags   Her skin tags are causing irritation. We explained the removal procedure involves numbing and excision. We will schedule an appointment for skin tag removal if needed.  General Health Maintenance   During a routine physical examination for a 47 year old, we discussed health maintenance screenings and vaccinations. She agreed to a flu shot and Tdap vaccine. We recommended a colonoscopy as she is now 47. Her last Pap smear was in December last year, with the next due in December or January. We will order a colonoscopy, administer the flu shot and Tdap vaccine, order blood work, and schedule a follow-up in six months.        No follow-ups on file.    [name], DO

## 2023-05-29 LAB — HEPATITIS C ANTIBODY: Hepatitis C Ab: NONREACTIVE

## 2023-06-27 NOTE — Telephone Encounter (Signed)
LVMTCB to schedule overdue follow up to continue filling future refills.

## 2023-11-26 ENCOUNTER — Other Ambulatory Visit: Payer: Self-pay | Admitting: Family Medicine

## 2023-11-26 DIAGNOSIS — I10 Essential (primary) hypertension: Secondary | ICD-10-CM

## 2023-12-24 ENCOUNTER — Other Ambulatory Visit: Payer: Self-pay | Admitting: Family Medicine

## 2023-12-24 DIAGNOSIS — I10 Essential (primary) hypertension: Secondary | ICD-10-CM

## 2023-12-25 ENCOUNTER — Telehealth: Payer: Self-pay

## 2023-12-25 DIAGNOSIS — I10 Essential (primary) hypertension: Secondary | ICD-10-CM

## 2023-12-25 MED ORDER — CHLORTHALIDONE 25 MG PO TABS: 25.0000 mg | ORAL_TABLET | Freq: Every day | ORAL | 0 refills | Status: DC

## 2023-12-25 MED ORDER — AMLODIPINE BESYLATE 10 MG PO TABS: 10.0000 mg | ORAL_TABLET | Freq: Every day | ORAL | 0 refills | Status: DC

## 2023-12-25 NOTE — Telephone Encounter (Signed)
 Copied from CRM [id number]. Topic: Clinical - Medication Question >> Dec 25, 2023 11:45 AM Burnard DEL wrote: Reason for CRM: Patient is scheduled for a medication refill on July 1st.She would like to know If she could have a few pills prescribed to cover her until then of   amLODipine  (NORVASC ) 10 MG tablet  chlorthalidone  (HYGROTON ) 25 MG tablet  [id number] - [place], [place] - [address]  Phone: [phone] Fax: [phone]

## 2023-12-25 NOTE — Telephone Encounter (Signed)
 Appt scheduled, Rx's sent.

## 2023-12-31 ENCOUNTER — Ambulatory Visit: Admitting: Family Medicine

## 2024-01-29 ENCOUNTER — Other Ambulatory Visit: Payer: Self-pay | Admitting: Family Medicine

## 2024-01-29 DIAGNOSIS — I10 Essential (primary) hypertension: Secondary | ICD-10-CM

## 2024-02-07 ENCOUNTER — Ambulatory Visit (INDEPENDENT_AMBULATORY_CARE_PROVIDER_SITE_OTHER): Admitting: Family Medicine

## 2024-02-07 VITALS — BP 120/84 | HR 90 | Temp 98.2°F | Resp 18 | Ht 65.0 in | Wt 254.6 lb

## 2024-02-07 DIAGNOSIS — I10 Essential (primary) hypertension: Secondary | ICD-10-CM

## 2024-02-07 NOTE — Progress Notes (Signed)
 Subjective:    Patient ID: Christine Walter, female    DOB: September 11, 1975, 48 y.o.   MRN: [id number]  Chief Complaint  Patient presents with   Hypertension   Follow-up    HPI Patient is in today for bp f/u.  Discussed the use of AI scribe software for clinical note transcription with the patient, who gave verbal consent to proceed.  History of Present Illness Christine Walter is a 48 year old female with hypertension who presents for a routine follow-up visit.  She recently received a refill for her blood pressure medication last week. She does not require any additional refills at this time.  She is actively trying to reduce her salt intake, noting that everything tastes salty. She has been using pink Himalayan salt as an alternative to regular table salt and uses less of it.  She underwent a mammogram at the beginning of the year and a colonoscopy in March or April at [facility]. She describes the colonoscopy preparation as unpleasant, but the procedure itself was not problematic, and she appreciated the sedation.  No swelling in her legs.  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    Past Medical History:  Diagnosis Date   Hypertension    Pure hypercholesterolemia 09/02/2020   Vitamin D  deficiency     Past Surgical History:  Procedure Laterality Date   CHOLECYSTECTOMY     DILATION AND EVACUATION     kidney stent      Family History  Problem Relation Age of Onset   Diabetes Mother    Hypertension Mother    Kidney disease Paternal Grandfather    Stroke Maternal Aunt    Stroke Maternal Uncle    Kidney disease Maternal Grandmother     Social History   Socioeconomic History   Marital status: Married    Spouse name: Not on file   Number of children: Not on file   Years of education: Not on file   Highest education level:  Bachelor's degree (e.g., BA, AB, BS)  Occupational History   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  Tobacco Use   Smoking status: Never   Smokeless tobacco: Never  Substance and Sexual Activity   Alcohol use: No    Alcohol/week: 0.0 standard drinks of alcohol    Comment: rare   Drug use: No   Sexual activity: Yes    Partners: Male  Other Topics Concern   Not on file  Social History Narrative   Exercise --- no-- but a walking challenge has started at work   Social Drivers of Corporate investment banker Strain: Low Risk  (02/07/2024)   Overall Financial Resource Strain (CARDIA)    Difficulty of Paying Living Expenses: Not hard at all  Food Insecurity: No Food Insecurity (02/07/2024)   Hunger Vital Sign    Worried About Running Out of Food in the Last Year: Never true    Ran Out of Food in the Last Year: Never true  Transportation Needs: No Transportation Needs (02/07/2024)   PRAPARE - Administrator, Civil Service (Medical): No    Lack of Transportation (Non-Medical): No  Physical Activity: Inactive (02/07/2024)   Exercise Vital Sign    Days of Exercise per Week: 0 days    Minutes of Exercise per Session: Not on  file  Stress: No Stress Concern Present (02/07/2024)   [employer] of Occupational Health - Occupational Stress Questionnaire    Feeling of Stress: Not at all  Social Connections: Moderately Integrated (02/07/2024)   Social Connection and Isolation Panel    Frequency of Communication with Friends and Family: More than three times a week    Frequency of Social Gatherings with Friends and Family: Twice a week    Attends Religious Services: More than 4 times per year    Active Member of [employer] or Organizations: No    Attends Engineer, structural: Not on file    Marital Status: Married  Catering manager Violence: Not on file    Outpatient Medications Prior to Visit  Medication Sig Dispense Refill    amLODipine  (NORVASC ) 10 MG tablet Take 1 tablet by mouth once daily 30 tablet 0   chlorthalidone  (HYGROTON ) 25 MG tablet Take 1 tablet by mouth once daily 30 tablet 0   No facility-administered medications prior to visit.    No Known Allergies  Review of Systems  Constitutional:  Negative for chills, fever and malaise/fatigue.  HENT:  Negative for congestion and hearing loss.   Eyes:  Negative for blurred vision and discharge.  Respiratory:  Negative for cough, sputum production and shortness of breath.   Cardiovascular:  Negative for chest pain, palpitations and leg swelling.  Gastrointestinal:  Negative for abdominal pain, blood in stool, constipation, diarrhea, heartburn, nausea and vomiting.  Genitourinary:  Negative for dysuria, frequency, hematuria and urgency.  Musculoskeletal:  Negative for back pain, falls and myalgias.  Skin:  Negative for rash.  Neurological:  Negative for dizziness, sensory change, loss of consciousness, weakness and headaches.  Endo/Heme/Allergies:  Negative for environmental allergies. Does not bruise/bleed easily.  Psychiatric/Behavioral:  Negative for depression and suicidal ideas. The patient is not nervous/anxious and does not have insomnia.        Objective:    Physical Exam Vitals and nursing note reviewed.  Constitutional:      General: She is not in acute distress.    Appearance: Normal appearance. She is well-developed.  HENT:     Head: Normocephalic and atraumatic.  Eyes:     General: No scleral icterus.       Right eye: No discharge.        Left eye: No discharge.  Cardiovascular:     Rate and Rhythm: Normal rate and regular rhythm.     Heart sounds: No murmur heard. Pulmonary:     Effort: Pulmonary effort is normal. No respiratory distress.     Breath sounds: Normal breath sounds.  Musculoskeletal:        General: Normal range of motion.     Cervical back: Normal range of motion and neck supple.     Right lower leg: No edema.      Left lower leg: No edema.  Skin:    General: Skin is warm and dry.  Neurological:     Mental Status: She is alert and oriented to person, place, and time.  Psychiatric:        Mood and Affect: Mood normal.        Behavior: Behavior normal.        Thought Content: Thought content normal.        Judgment: Judgment normal.     BP 120/84 (BP Location: Left Arm, Patient Position: Sitting, Cuff Size: Large)   Pulse 90   Temp 98.2 F (36.8 C) (Oral)   Resp  18   Ht 5' 5 (1.651 m)   Wt 254 lb 9.6 oz (115.5 kg)   SpO2 96%   BMI 42.37 kg/m  Wt Readings from Last 3 Encounters:  02/07/24 254 lb 9.6 oz (115.5 kg)  05/28/23 251 lb 3.2 oz (113.9 kg)  12/18/21 245 lb (111.1 kg)    Diabetic Foot Exam - Simple   No data filed    Lab Results  Component Value Date   WBC 6.4 05/28/2023   HGB 13.0 05/28/2023   HCT 40.4 05/28/2023   PLT 229.0 05/28/2023   GLUCOSE 86 05/28/2023   CHOL 233 (H) 05/28/2023   TRIG 115.0 05/28/2023   HDL 49.40 05/28/2023   LDLCALC 161 (H) 05/28/2023   ALT 14 05/28/2023   AST 14 05/28/2023   NA 138 05/28/2023   K 3.9 05/28/2023   CL 103 05/28/2023   CREATININE 0.87 05/28/2023   BUN 11 05/28/2023   CO2 29 05/28/2023   TSH 1.32 05/28/2023   HGBA1C 5.3 05/06/2020    Lab Results  Component Value Date   TSH 1.32 05/28/2023   Lab Results  Component Value Date   WBC 6.4 05/28/2023   HGB 13.0 05/28/2023   HCT 40.4 05/28/2023   MCV 86.6 05/28/2023   PLT 229.0 05/28/2023   Lab Results  Component Value Date   NA 138 05/28/2023   K 3.9 05/28/2023   CO2 29 05/28/2023   GLUCOSE 86 05/28/2023   BUN 11 05/28/2023   CREATININE 0.87 05/28/2023   BILITOT 0.5 05/28/2023   ALKPHOS 96 05/28/2023   AST 14 05/28/2023   ALT 14 05/28/2023   PROT 6.9 05/28/2023   ALBUMIN 4.0 05/28/2023   CALCIUM 9.2 05/28/2023   ANIONGAP 14 07/24/2021   EGFR 70 12/09/2020   GFR 80.62 05/28/2023   Lab Results  Component Value Date   CHOL 233 (H) 05/28/2023   Lab  Results  Component Value Date   HDL 49.40 05/28/2023   Lab Results  Component Value Date   LDLCALC 161 (H) 05/28/2023   Lab Results  Component Value Date   TRIG 115.0 05/28/2023   Lab Results  Component Value Date   CHOLHDL 5 05/28/2023   Lab Results  Component Value Date   HGBA1C 5.3 05/06/2020       Assessment & Plan:  Essential hypertension  Assessment and Plan Assessment & Plan Hypertension   Hypertension is well-controlled. She actively reduces salt intake, using pink Himalayan salt as an alternative, and increases physical activity by walking more on campus, aiding in stress and blood pressure management. Encourage ongoing reduction in salt intake and use of alternatives like pink Himalayan salt. Encourage regular physical activity, such as walking, to help manage blood pressure and stress.   Eular Panek R Lowne Chase, DO

## 2024-02-09 ENCOUNTER — Other Ambulatory Visit: Payer: Self-pay | Admitting: Family Medicine

## 2024-02-09 ENCOUNTER — Ambulatory Visit: Payer: Self-pay | Admitting: Family Medicine

## 2024-02-09 DIAGNOSIS — E7800 Pure hypercholesterolemia, unspecified: Secondary | ICD-10-CM

## 2024-02-10 LAB — CBC WITH DIFFERENTIAL/PLATELET
Absolute Lymphocytes: 2048 {cells}/uL (ref 850–3900)
Absolute Monocytes: 643 {cells}/uL (ref 200–950)
Basophils Absolute: 19 {cells}/uL (ref 0–200)
Basophils Relative: 0.3 %
Eosinophils Absolute: 88 {cells}/uL (ref 15–500)
Eosinophils Relative: 1.4 %
HCT: 41.4 % (ref 35.0–45.0)
Hemoglobin: 12.7 g/dL (ref 11.7–15.5)
MCH: 26.7 pg — ABNORMAL LOW (ref 27.0–33.0)
MCHC: 30.7 g/dL — ABNORMAL LOW (ref 32.0–36.0)
MCV: 87.2 fL (ref 80.0–100.0)
MPV: 11 fL (ref 7.5–12.5)
Monocytes Relative: 10.2 %
Neutro Abs: 3503 {cells}/uL (ref 1500–7800)
Neutrophils Relative %: 55.6 %
Platelets: 248 Thousand/uL (ref 140–400)
RBC: 4.75 Million/uL (ref 3.80–5.10)
RDW: 12.9 % (ref 11.0–15.0)
Total Lymphocyte: 32.5 %
WBC: 6.3 Thousand/uL (ref 3.8–10.8)

## 2024-02-10 LAB — COMPREHENSIVE METABOLIC PANEL WITH GFR
AG Ratio: 1.3 (calc) (ref 1.0–2.5)
ALT: 13 U/L (ref 6–29)
AST: 12 U/L (ref 10–35)
Albumin: 4.1 g/dL (ref 3.6–5.1)
Alkaline phosphatase (APISO): 105 U/L (ref 31–125)
BUN: 12 mg/dL (ref 7–25)
CO2: 27 mmol/L (ref 20–32)
Calcium: 9.4 mg/dL (ref 8.6–10.2)
Chloride: 99 mmol/L (ref 98–110)
Creat: 0.92 mg/dL (ref 0.50–0.99)
Globulin: 3.2 g/dL (ref 1.9–3.7)
Glucose, Bld: 129 mg/dL — ABNORMAL HIGH (ref 65–99)
Potassium: 3.4 mmol/L — ABNORMAL LOW (ref 3.5–5.3)
Sodium: 137 mmol/L (ref 135–146)
Total Bilirubin: 0.5 mg/dL (ref 0.2–1.2)
Total Protein: 7.3 g/dL (ref 6.1–8.1)
eGFR: 78 mL/min/1.73m2 (ref >=60)

## 2024-02-10 LAB — LIPID PANEL
Cholesterol: 239 mg/dL — ABNORMAL HIGH (ref <200)
HDL: 50 mg/dL (ref >=50)
LDL Cholesterol (Calc): 163 mg/dL — ABNORMAL HIGH
Non-HDL Cholesterol (Calc): 189 mg/dL — ABNORMAL HIGH (ref <130)
Total CHOL/HDL Ratio: 4.8 (calc) (ref <5.0)
Triglycerides: 132 mg/dL (ref <150)

## 2024-02-10 LAB — INSULIN, RANDOM: Insulin: 102 u[IU]/mL — ABNORMAL HIGH

## 2024-02-10 LAB — HEMOGLOBIN A1C
Hgb A1c MFr Bld: 5.6 % (ref <5.7)
Mean Plasma Glucose: 114 mg/dL
eAG (mmol/L): 6.3 mmol/L

## 2024-02-10 LAB — TSH: TSH: 1 m[IU]/L

## 2024-02-28 ENCOUNTER — Other Ambulatory Visit: Payer: Self-pay | Admitting: Family Medicine

## 2024-02-28 DIAGNOSIS — I10 Essential (primary) hypertension: Secondary | ICD-10-CM

## 2024-05-19 ENCOUNTER — Other Ambulatory Visit: Payer: Self-pay | Admitting: Family Medicine

## 2024-05-19 DIAGNOSIS — I10 Essential (primary) hypertension: Secondary | ICD-10-CM

## 2024-08-04 ENCOUNTER — Ambulatory Visit: Payer: Self-pay | Admitting: Surgery

## 2024-08-04 NOTE — Progress Notes (Signed)
 Sent message, via epic in basket, requesting orders in epic from Careers adviser.

## 2024-08-06 ENCOUNTER — Other Ambulatory Visit: Payer: Self-pay

## 2024-08-06 ENCOUNTER — Encounter (HOSPITAL_COMMUNITY): Admission: RE | Admit: 2024-08-06 | Discharge: 2024-08-06 | Attending: Surgery

## 2024-08-06 ENCOUNTER — Encounter (HOSPITAL_COMMUNITY): Payer: Self-pay

## 2024-08-06 VITALS — BP 159/98 | HR 93 | Temp 98.5°F | Resp 16 | Ht 65.0 in | Wt 244.0 lb

## 2024-08-06 DIAGNOSIS — Z01818 Encounter for other preprocedural examination: Secondary | ICD-10-CM

## 2024-08-06 HISTORY — DX: Personal history of urinary calculi: Z87.442

## 2024-08-06 HISTORY — DX: Gastro-esophageal reflux disease without esophagitis: K21.9

## 2024-08-06 LAB — BASIC METABOLIC PANEL WITH GFR
Anion gap: 12 (ref 5–15)
BUN: 9 mg/dL (ref 6–20)
CO2: 27 mmol/L (ref 22–32)
Calcium: 9.4 mg/dL (ref 8.9–10.3)
Chloride: 99 mmol/L (ref 98–111)
Creatinine, Ser: 0.96 mg/dL (ref 0.44–1.00)
GFR, Estimated: >60 mL/min
Glucose, Bld: 110 mg/dL — ABNORMAL HIGH (ref 70–99)
Potassium: 4.3 mmol/L (ref 3.5–5.1)
Sodium: 138 mmol/L (ref 135–145)

## 2024-08-06 LAB — CBC
HCT: 43.4 % (ref 36.0–46.0)
Hemoglobin: 13.6 g/dL (ref 12.0–15.0)
MCH: 27.1 pg (ref 26.0–34.0)
MCHC: 31.3 g/dL (ref 30.0–36.0)
MCV: 86.5 fL (ref 80.0–100.0)
Platelets: 260 10*3/uL (ref 150–400)
RBC: 5.02 MIL/uL (ref 3.87–5.11)
RDW: 13.2 % (ref 11.5–15.5)
WBC: 6.5 10*3/uL (ref 4.0–10.5)
nRBC: 0 % (ref 0.0–0.2)

## 2024-08-12 ENCOUNTER — Encounter (HOSPITAL_COMMUNITY): Admission: RE | Payer: Self-pay | Source: Ambulatory Visit

## 2024-08-12 ENCOUNTER — Ambulatory Visit (HOSPITAL_COMMUNITY): Admission: RE | Admit: 2024-08-12 | Admitting: Surgery

## 2024-08-12 SURGERY — REPAIR, HERNIA, UMBILICAL, ADULT
Anesthesia: General
